# Patient Record
Sex: Male | Born: 1990 | Hispanic: No | Marital: Single | Smoking: Current some day smoker
Health system: Southern US, Community
[De-identification: ages and names within clinical notes are randomized; demographics above are authoritative.]

## PROBLEM LIST (undated history)

## (undated) DIAGNOSIS — I10 Essential (primary) hypertension: Secondary | ICD-10-CM

---

## 2018-01-30 ENCOUNTER — Emergency Department (HOSPITAL_COMMUNITY): Payer: Self-pay

## 2018-01-30 ENCOUNTER — Encounter (HOSPITAL_COMMUNITY): Payer: Self-pay

## 2018-01-30 ENCOUNTER — Emergency Department (HOSPITAL_COMMUNITY)
Admission: EM | Admit: 2018-01-30 | Discharge: 2018-01-30 | Disposition: A | Discharge status: Home / Self Care | Payer: Self-pay | Attending: Emergency Medicine | Admitting: Emergency Medicine

## 2018-01-30 ENCOUNTER — Other Ambulatory Visit: Payer: Self-pay

## 2018-01-30 DIAGNOSIS — Y9389 Activity, other specified: Secondary | ICD-10-CM | POA: Insufficient documentation

## 2018-01-30 DIAGNOSIS — R1032 Left lower quadrant pain: Secondary | ICD-10-CM | POA: Insufficient documentation

## 2018-01-30 DIAGNOSIS — S069X0A Unspecified intracranial injury without loss of consciousness, initial encounter: Secondary | ICD-10-CM

## 2018-01-30 DIAGNOSIS — S0081XA Abrasion of other part of head, initial encounter: Secondary | ICD-10-CM | POA: Insufficient documentation

## 2018-01-30 DIAGNOSIS — S062X9A Diffuse traumatic brain injury with loss of consciousness of unspecified duration, initial encounter: Secondary | ICD-10-CM | POA: Insufficient documentation

## 2018-01-30 DIAGNOSIS — Y92009 Unspecified place in unspecified non-institutional (private) residence as the place of occurrence of the external cause: Secondary | ICD-10-CM | POA: Insufficient documentation

## 2018-01-30 DIAGNOSIS — F17210 Nicotine dependence, cigarettes, uncomplicated: Secondary | ICD-10-CM | POA: Insufficient documentation

## 2018-01-30 DIAGNOSIS — Y999 Unspecified external cause status: Secondary | ICD-10-CM | POA: Insufficient documentation

## 2018-01-30 LAB — CBC WITH DIFFERENTIAL/PLATELET
Basophils Absolute: 0.1 10*3/uL (ref 0.0–0.1)
Basophils Relative: 1 %
Eosinophils Absolute: 0 10*3/uL (ref 0.0–0.7)
Eosinophils Relative: 0 %
HEMATOCRIT: 47.1 % (ref 39.0–52.0)
HEMOGLOBIN: 16.4 g/dL (ref 13.0–17.0)
LYMPHS PCT: 18 %
Lymphs Abs: 1.1 10*3/uL (ref 0.7–4.0)
MCH: 31.2 pg (ref 26.0–34.0)
MCHC: 34.8 g/dL (ref 30.0–36.0)
MCV: 89.7 fL (ref 78.0–100.0)
MONO ABS: 0.4 10*3/uL (ref 0.1–1.0)
MONOS PCT: 6 %
NEUTROS ABS: 4.7 10*3/uL (ref 1.7–7.7)
NEUTROS PCT: 75 %
Platelets: 260 10*3/uL (ref 150–400)
RBC: 5.25 MIL/uL (ref 4.22–5.81)
RDW: 12.7 % (ref 11.5–15.5)
WBC: 6.2 10*3/uL (ref 4.0–10.5)

## 2018-01-30 LAB — URINALYSIS, ROUTINE W REFLEX MICROSCOPIC
BACTERIA UA: NONE SEEN
Bilirubin Urine: NEGATIVE
GLUCOSE, UA: NEGATIVE mg/dL
Ketones, ur: NEGATIVE mg/dL
Leukocytes, UA: NEGATIVE
Nitrite: NEGATIVE
PH: 6 (ref 5.0–8.0)
PROTEIN: NEGATIVE mg/dL
Specific Gravity, Urine: 1.044 — ABNORMAL HIGH (ref 1.005–1.030)

## 2018-01-30 LAB — COMPREHENSIVE METABOLIC PANEL
ALBUMIN: 4.6 g/dL (ref 3.5–5.0)
ALT: 32 U/L (ref 0–44)
AST: 35 U/L (ref 15–41)
Alkaline Phosphatase: 80 U/L (ref 38–126)
Anion gap: 17 — ABNORMAL HIGH (ref 5–15)
BUN: 9 mg/dL (ref 6–20)
CHLORIDE: 99 mmol/L (ref 98–111)
CO2: 25 mmol/L (ref 22–32)
Calcium: 9.4 mg/dL (ref 8.9–10.3)
Creatinine, Ser: 0.72 mg/dL (ref 0.61–1.24)
GFR calc Af Amer: >60 mL/min (ref >60)
GFR calc non Af Amer: >60 mL/min (ref >60)
GLUCOSE: 92 mg/dL (ref 70–99)
POTASSIUM: 3.8 mmol/L (ref 3.5–5.1)
SODIUM: 141 mmol/L (ref 135–145)
Total Bilirubin: 0.7 mg/dL (ref 0.3–1.2)
Total Protein: 8.7 g/dL — ABNORMAL HIGH (ref 6.5–8.1)

## 2018-01-30 LAB — LIPASE, BLOOD: Lipase: 28 U/L (ref 11–51)

## 2018-01-30 LAB — CBG MONITORING, ED: GLUCOSE-CAPILLARY: 100 mg/dL — AB (ref 70–99)

## 2018-01-30 MED ORDER — IBUPROFEN 800 MG PO TABS: 800.0000 mg | ORAL_TABLET | Freq: Three times a day (TID) | ORAL | 0 refills | Status: DC | PRN

## 2018-01-30 MED ORDER — IOPAMIDOL (ISOVUE-300) INJECTION 61%
INTRAVENOUS | Status: AC
  Filled 2018-01-30: qty 100

## 2018-01-30 MED ORDER — ONDANSETRON 4 MG PO TBDP: 4.0000 mg | ORAL_TABLET | Freq: Three times a day (TID) | ORAL | 0 refills | Status: DC | PRN

## 2018-01-30 MED ORDER — IOPAMIDOL (ISOVUE-300) INJECTION 61%
100.0000 mL | Freq: Once | INTRAVENOUS | Status: AC | PRN
  Administered 2018-01-30: 100 mL via INTRAVENOUS

## 2018-01-30 MED ORDER — FENTANYL CITRATE (PF) 100 MCG/2ML IJ SOLN
50.0000 ug | Freq: Once | INTRAMUSCULAR | Status: AC
  Administered 2018-01-30: 50 ug via INTRAVENOUS
  Filled 2018-01-30: qty 2

## 2018-01-30 MED ORDER — SODIUM CHLORIDE 0.9 % IV BOLUS
1000.0000 mL | Freq: Once | INTRAVENOUS | Status: AC
  Administered 2018-01-30: 1000 mL via INTRAVENOUS

## 2018-01-30 NOTE — ED Notes (Signed)
In Xray and ct

## 2018-01-30 NOTE — ED Notes (Signed)
ED Provider at bedside. 
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
## 2018-01-30 NOTE — Discharge Instructions (Signed)
Usted fue visto hoy en el Departamento de Emergencia (DE) por una lesin en la cabeza. Segn su evaluacin, es posible que haya sufrido una conmocin cerebral (o hematoma) en el cerebro. Si le hicieron una tomografa computarizada, no mostr ninguna evidencia de lesin grave o sangrado.  Los sntomas que se pueden esperar de una conmocin cerebral incluyen nuseas, dolor de cabeza leve a moderado, dificultad para concentrarse o dormir, y Energy East Corporationmareos leves. Estos sntomas deberan Scientist, clinical (histocompatibility and immunogenetics)mejorar en los prximos das o [address], pero pueden pasar muchas semanas antes de que vuelva a la normalidad. Regrese al departamento de emergencias o haga un seguimiento con su mdico de atencin primaria si sus sntomas no mejoran [employer] este tiempo.  Los signos de una lesin en la cabeza ms grave incluyen vmitos, dolor de cabeza intenso, somnolencia o confusin excesiva, y debilidad o entumecimiento en la cara, brazos o piernas. Regrese de inmediato al Departamento de emergencias si experimenta alguno de estos sntomas ms preocupantes.  Descanse, evite la actividad fsica o mental extenuante y evite las actividades que podran provocar otra lesin en la cabeza hasta que todos los sntomas de esta lesin en la cabeza se resuelvan por completo durante al menos 2-3 semanas. Si participa en deportes, obtenga la autorizacin de su mdico o entrenador antes de volver a Leisure centre managerjugar. Puede tomar ibuprofeno o acetaminofeno sin receta mdica de acuerdo con las instrucciones de la etiqueta para el dolor de cabeza leve o dolor de cuero cabelludo.

## 2018-04-29 ENCOUNTER — Encounter (HOSPITAL_COMMUNITY): Payer: Self-pay

## 2018-04-29 ENCOUNTER — Other Ambulatory Visit: Payer: Self-pay

## 2018-04-29 ENCOUNTER — Emergency Department (HOSPITAL_COMMUNITY)
Admission: EM | Admit: 2018-04-29 | Discharge: 2018-04-29 | Disposition: A | Discharge status: Home / Self Care | Payer: Self-pay | Attending: Emergency Medicine | Admitting: Emergency Medicine

## 2018-04-29 DIAGNOSIS — R040 Epistaxis: Secondary | ICD-10-CM | POA: Insufficient documentation

## 2018-04-29 DIAGNOSIS — R042 Hemoptysis: Secondary | ICD-10-CM | POA: Insufficient documentation

## 2018-04-29 DIAGNOSIS — F17210 Nicotine dependence, cigarettes, uncomplicated: Secondary | ICD-10-CM | POA: Insufficient documentation

## 2018-04-29 NOTE — ED Provider Notes (Signed)
[facility]-EMERGENCY DEPT Provider Note   CSN: [id number] Arrival date & time: 04/29/18  1623     History   Chief Complaint Chief Complaint  Patient presents with  . Sore Throat    HPI Roy Hester is a 27 y.o. male.  HPI   Roy Hester is a 27 y.o. male, patient with no pertinent past medical history, presenting to the ED with blood in his sputum noted early this morning.  States he woke up to use the bathroom, coughed, and noted a speck of blood in the sputum.  Shortly prior to arrival, he notes that he sneezed and there was blood in the mucus, followed by a small trickle of blood.  Denies pain, fever, sore throat, continued cough, shortness of breath, chest pain, leg swelling, or any other complaints.  History reviewed. No pertinent past medical history.  There are no active problems to display for this patient.   History reviewed. No pertinent surgical history.      Home Medications    Prior to Admission medications   Medication Sig Start Date End Date Taking? Authorizing Provider  ibuprofen (ADVIL,MOTRIN) 800 MG tablet Take 1 tablet (800 mg total) by mouth every 8 (eight) hours as needed. 01/30/18   Long, Arlyss Repress, MD  ondansetron (ZOFRAN ODT) 4 MG disintegrating tablet Take 1 tablet (4 mg total) by mouth every 8 (eight) hours as needed for nausea or vomiting. 01/30/18   Long, Arlyss Repress, MD    Family History Family History  Problem Relation Age of Onset  . Healthy Mother   . Healthy Father     Social History Social History   Tobacco Use  . Smoking status: Current Some Day Smoker    Packs/day: 1.00    Types: Cigarettes  . Smokeless tobacco: Never Used  Substance Use Topics  . Alcohol use: Yes    Comment: Every now and then  . Drug use: Never     Allergies   Pork allergy   Review of Systems Review of Systems  Constitutional: Negative for fever.  HENT: Positive for nosebleeds.        Blood in sputum    Respiratory: Negative for shortness of breath.   Cardiovascular: Negative for chest pain.  Gastrointestinal: Negative for nausea and vomiting.     Physical Exam Updated Vital Signs BP (!) 145/91 (BP Location: Left Arm)   Pulse 86   Temp 98.5 F (36.9 C) (Oral)   Resp 16   Ht 5\' 5"  (1.651 m)   Wt 73.5 kg   SpO2 98%   BMI 26.96 kg/m   Physical Exam  Constitutional: He appears well-developed and well-nourished. No distress.  HENT:  Head: Normocephalic and atraumatic.  Mouth/Throat: Uvula is midline and oropharynx is clear and moist.  Small amount of dried blood in the left nare.  No active hemorrhage.  Eyes: Conjunctivae are normal.  Neck: Normal range of motion. Neck supple.  Cardiovascular: Normal rate and regular rhythm.  Pulmonary/Chest: Effort normal.  Lymphadenopathy:    He has no cervical adenopathy.  Neurological: He is alert.  Skin: Skin is warm and dry. He is not diaphoretic. No pallor.  Psychiatric: He has a normal mood and affect. His behavior is normal.  Nursing note and vitals reviewed.    ED Treatments / Results  Labs (all labs ordered are listed, but only abnormal results are displayed) Labs Reviewed - No data to display  EKG None  Radiology No results found.  Procedures Procedures (  including critical care time)  Medications Ordered in ED Medications - No data to display   Initial Impression / Assessment and Plan / ED Course  I have reviewed the triage vital signs and the nursing notes.  Pertinent labs & imaging results that were available during my care of the patient were reviewed by me and considered in my medical decision making (see chart for details).     Patient presents with a complaint of specks of blood in the sputum.  He later had a small nosebleed.  Suspect the source for the patient's bleeding may be his left nare.  Bleeding controlled at presentation. The patient was given instructions for home care as well as return  precautions. Patient voices understanding of these instructions, accepts the plan, and is comfortable with discharge.  Final Clinical Impressions(s) / ED Diagnoses   Final diagnoses:  Blood in sputum    ED Discharge Orders    None       Concepcion Living 04/29/18 1719    Long, Arlyss Repress, MD 04/30/18 [phone]

## 2018-04-29 NOTE — ED Triage Notes (Signed)
Patient reports that he had a sore throat since last night. Patient states when he clears his throat from mucus he has a "tiny amount of blood "in the sputum.

## 2018-04-29 NOTE — Discharge Instructions (Signed)
Apply Vaseline (petroleum jelly) into the nares of the nose to keep them moisturized and prevent bleeding. Please drink plenty of water to stay well-hydrated.
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
## 2020-12-29 ENCOUNTER — Other Ambulatory Visit: Payer: Self-pay

## 2020-12-29 ENCOUNTER — Encounter (HOSPITAL_COMMUNITY): Payer: Self-pay

## 2020-12-29 ENCOUNTER — Emergency Department (HOSPITAL_COMMUNITY)
Admission: EM | Admit: 2020-12-29 | Discharge: 2020-12-30 | Disposition: A | Discharge status: Home / Self Care | Payer: Self-pay | Attending: Emergency Medicine | Admitting: Emergency Medicine

## 2020-12-29 DIAGNOSIS — M546 Pain in thoracic spine: Secondary | ICD-10-CM | POA: Insufficient documentation

## 2020-12-29 DIAGNOSIS — F10129 Alcohol abuse with intoxication, unspecified: Secondary | ICD-10-CM | POA: Insufficient documentation

## 2020-12-29 DIAGNOSIS — F17210 Nicotine dependence, cigarettes, uncomplicated: Secondary | ICD-10-CM | POA: Insufficient documentation

## 2020-12-29 NOTE — ED Provider Notes (Signed)
[facility]-EMERGENCY DEPT Provider Note   CSN: [id number] Arrival date & time: 12/29/20  2248     History Chief Complaint  Patient presents with   Back Pain    Roy Hester is a 30 y.o. male.  The history is provided by the patient and medical records.  Back Pain  30 y.o. M presenting to the ED for reported back pain, however EMS reports they were called because he was concerned for low BP.  Patient had heavy EtOH today.  He is snoring on arrival to the ED and is not able to provide any reliable history.  LEVEL V CAVEAT APPLIES DUE TO INTOXICATION.  History reviewed. No pertinent past medical history.  There are no problems to display for this patient.   History reviewed. No pertinent surgical history.     Family History  Problem Relation Age of Onset   Healthy Mother    Healthy Father     Social History   Tobacco Use   Smoking status: Some Days    Packs/day: 1.00    Pack years: 0.00    Types: Cigarettes   Smokeless tobacco: Never  Vaping Use   Vaping Use: Never used  Substance Use Topics   Alcohol use: Yes    Comment: Every now and then   Drug use: Never    Home Medications Prior to Admission medications   Medication Sig Start Date End Date Taking? Authorizing Provider  ibuprofen (ADVIL,MOTRIN) 800 MG tablet Take 1 tablet (800 mg total) by mouth every 8 (eight) hours as needed. 01/30/18   Long, Arlyss Repress, MD  ondansetron (ZOFRAN ODT) 4 MG disintegrating tablet Take 1 tablet (4 mg total) by mouth every 8 (eight) hours as needed for nausea or vomiting. 01/30/18   Long, Arlyss Repress, MD    Allergies    Pork allergy  Review of Systems   Review of Systems  Unable to perform ROS: Other   Physical Exam Updated Vital Signs BP 138/88   Pulse (!) 108   Temp (!) 97.5 F (36.4 C) (Oral)   Resp 18   Ht 5\' 5"  (1.651 m)   Wt 73.5 kg   SpO2 94%   BMI 26.96 kg/m   Physical Exam Vitals and nursing note reviewed.  Constitutional:       Appearance: He is well-developed.     Comments: Snoring, smells of EtOH  HENT:     Head: Normocephalic and atraumatic.  Eyes:     Conjunctiva/sclera: Conjunctivae normal.     Pupils: Pupils are equal, round, and reactive to light.  Cardiovascular:     Rate and Rhythm: Normal rate and regular rhythm.     Heart sounds: Normal heart sounds.  Pulmonary:     Effort: Pulmonary effort is normal. No respiratory distress.     Breath sounds: Normal breath sounds. No rhonchi.  Abdominal:     General: Bowel sounds are normal.     Palpations: Abdomen is soft.     Tenderness: There is no abdominal tenderness. There is no rebound.  Musculoskeletal:        General: Normal range of motion.     Cervical back: Normal range of motion.     Comments: Left lumbar paraspinal muscular tenderness, no deformity or step-off, moving extremities well  Skin:    General: Skin is warm and dry.  Neurological:     Mental Status: He is alert and oriented to person, place, and time.    ED Results /  Procedures / Treatments   Labs (all labs ordered are listed, but only abnormal results are displayed) Labs Reviewed - No data to display  EKG None  Radiology No results found.  Procedures Procedures   Medications Ordered in ED Medications - No data to display  ED Course  I have reviewed the triage vital signs and the nursing notes.  Pertinent labs & imaging results that were available during my care of the patient were reviewed by me and considered in my medical decision making (see chart for details).    MDM Rules/Calculators/A&P  30 year old male presenting to the ED with reported back pain, however EMS reported he called out because he thought his blood pressure was low.  Does have EtOH on board and is snoring loudly on arrival to ED.  He does smell of EtOH.  He is not able to provide any reliable history at this time but is hemodynamically stable.  Will observe until able to give further  information.  5:29 AM After sleeping for a few hours patient now awake/alert.  He does report some left sided back pain after work.  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  Denies numbness/weakness of the legs.  No bowel or bladder incontinence.  No fevers, weight loss, history of cancer.  No hx of IVDU. Has some tenderness along left paraspinal musculature.  No midline deformity or step-off.  No focal deficits.  Suspect muscular strain.  Will treat symptomatically.  Advised not to mix medications with alcohol.  Encouraged to follow-up with PCP.  Return here for new concerns.  Final Clinical Impression(s) / ED Diagnoses Final diagnoses:  Acute left-sided thoracic back pain    Rx / DC Orders ED Discharge Orders          Ordered    ibuprofen (ADVIL) 800 MG tablet  3 times daily        12/30/20 0535    methocarbamol (ROBAXIN) 500 MG tablet  2 times daily        12/30/20 0535    lidocaine (LIDODERM) 5 %  Every 24 hours        12/30/20 0535             Garlon Hatchet, PA-C 12/30/20 0539    Palumbo, April, MD 12/30/20 [phone]

## 2020-12-29 NOTE — ED Triage Notes (Signed)
Patient complaining of back pain. Patient brought in by ptar. Patient called EMS because he thought his blood pressure was low. Patient wife Josephina [phone].

## 2020-12-30 MED ORDER — METHOCARBAMOL 500 MG PO TABS: 500.0000 mg | ORAL_TABLET | Freq: Two times a day (BID) | ORAL | 0 refills | Status: DC

## 2020-12-30 MED ORDER — IBUPROFEN 800 MG PO TABS: 800.0000 mg | ORAL_TABLET | Freq: Three times a day (TID) | ORAL | 0 refills | Status: DC

## 2020-12-30 MED ORDER — LIDOCAINE 5 % EX PTCH: 1.0000 | MEDICATED_PATCH | CUTANEOUS | 0 refills | Status: DC

## 2020-12-30 NOTE — Discharge Instructions (Addendum)
Take the prescribed medication as directed.  DO NOT MIX MEDICATIONS WITH ALCOHOL. Can use heating pad on back as well. Follow-up with your primary care doctor. Return to the ED for new or worsening symptoms.

## 2022-06-16 ENCOUNTER — Emergency Department (HOSPITAL_COMMUNITY)
Admission: EM | Admit: 2022-06-16 | Discharge: 2022-06-16 | Discharge status: Against Medical Advice | Payer: Self-pay | Attending: Emergency Medicine | Admitting: Emergency Medicine

## 2022-06-16 ENCOUNTER — Other Ambulatory Visit: Payer: Self-pay

## 2022-06-16 ENCOUNTER — Encounter (HOSPITAL_COMMUNITY): Payer: Self-pay

## 2022-06-16 ENCOUNTER — Emergency Department (HOSPITAL_COMMUNITY): Payer: Self-pay

## 2022-06-16 DIAGNOSIS — R0789 Other chest pain: Secondary | ICD-10-CM | POA: Insufficient documentation

## 2022-06-16 DIAGNOSIS — R7309 Other abnormal glucose: Secondary | ICD-10-CM | POA: Insufficient documentation

## 2022-06-16 DIAGNOSIS — Z5321 Procedure and treatment not carried out due to patient leaving prior to being seen by health care provider: Secondary | ICD-10-CM | POA: Insufficient documentation

## 2022-06-16 HISTORY — DX: Essential (primary) hypertension: I10

## 2022-06-16 LAB — SALICYLATE LEVEL: Salicylate Lvl: <7 mg/dL — ABNORMAL LOW (ref 7.0–30.0)

## 2022-06-16 LAB — COMPREHENSIVE METABOLIC PANEL
ALT: 30 U/L (ref 0–44)
AST: 49 U/L — ABNORMAL HIGH (ref 15–41)
Albumin: 4.2 g/dL (ref 3.5–5.0)
Alkaline Phosphatase: 88 U/L (ref 38–126)
Anion gap: 14 (ref 5–15)
BUN: 8 mg/dL (ref 6–20)
CO2: 24 mmol/L (ref 22–32)
Calcium: 8.7 mg/dL — ABNORMAL LOW (ref 8.9–10.3)
Chloride: 100 mmol/L (ref 98–111)
Creatinine, Ser: 0.79 mg/dL (ref 0.61–1.24)
GFR, Estimated: >60 mL/min (ref >60)
Glucose, Bld: 85 mg/dL (ref 70–99)
Potassium: 3.7 mmol/L (ref 3.5–5.1)
Sodium: 138 mmol/L (ref 135–145)
Total Bilirubin: 0.5 mg/dL (ref 0.3–1.2)
Total Protein: 8.8 g/dL — ABNORMAL HIGH (ref 6.5–8.1)

## 2022-06-16 LAB — CBC WITH DIFFERENTIAL/PLATELET
Abs Immature Granulocytes: 0.01 10*3/uL (ref 0.00–0.07)
Basophils Absolute: 0.1 10*3/uL (ref 0.0–0.1)
Basophils Relative: 1 %
Eosinophils Absolute: 0.1 10*3/uL (ref 0.0–0.5)
Eosinophils Relative: 2 %
HCT: 49 % (ref 39.0–52.0)
Hemoglobin: 16.9 g/dL (ref 13.0–17.0)
Immature Granulocytes: 0 %
Lymphocytes Relative: 32 %
Lymphs Abs: 1.9 10*3/uL (ref 0.7–4.0)
MCH: 30.3 pg (ref 26.0–34.0)
MCHC: 34.5 g/dL (ref 30.0–36.0)
MCV: 87.8 fL (ref 80.0–100.0)
Monocytes Absolute: 0.3 10*3/uL (ref 0.1–1.0)
Monocytes Relative: 5 %
Neutro Abs: 3.6 10*3/uL (ref 1.7–7.7)
Neutrophils Relative %: 60 %
Platelets: 306 10*3/uL (ref 150–400)
RBC: 5.58 MIL/uL (ref 4.22–5.81)
RDW: 12.7 % (ref 11.5–15.5)
WBC: 6 10*3/uL (ref 4.0–10.5)
nRBC: 0 % (ref 0.0–0.2)

## 2022-06-16 LAB — TROPONIN I (HIGH SENSITIVITY)
Troponin I (High Sensitivity): 16 ng/L (ref <18)
Troponin I (High Sensitivity): 13 ng/L (ref <18)

## 2022-06-16 LAB — LIPASE, BLOOD: Lipase: 45 U/L (ref 11–51)

## 2022-06-16 LAB — CBG MONITORING, ED: Glucose-Capillary: 249 mg/dL — ABNORMAL HIGH (ref 70–99)

## 2022-06-16 LAB — ETHANOL: Alcohol, Ethyl (B): 380 mg/dL (ref <10)

## 2022-06-16 LAB — ACETAMINOPHEN LEVEL: Acetaminophen (Tylenol), Serum: <10 ug/mL — ABNORMAL LOW (ref 10–30)

## 2022-06-16 NOTE — ED Provider Triage Note (Signed)
Emergency Medicine Provider Triage Evaluation Note  Roy Hester , a 31 y.o. male  was evaluated in triage.  Pt complains of chest pain.  He has pain on the left upper chest.  There is a difficulty obtaining history due to language barrier.  Patient appears to be confused and I suspect he is intoxicated.  He admits to drinking heavily for the past 6 months.  He also states that his wife just left him.  He complains of sharp pain on the left side of his chest is worse when he touches the chest.  He had Roy Hester, Roy Hester is a(n) 31 y.o. male who presents 2, 12 ounce beers prior to arrival.  During screening questions patient states that he does not want to live anymore..  Review of Systems  Positive: cp Negative: fever  Physical Exam  BP (!) 146/92 (BP Location: Right Arm)   Pulse (!) 121   Temp 98.7 F (37.1 C)   Resp 18   Ht 5\' 5"  (1.651 m)   Wt 73.5 kg   SpO2 97%   BMI 26.96 kg/m  Gen:   Awake, no distress   Resp:  Normal effort  MSK:   Moves extremities without difficulty  Other:  Appears uncomfortable   Medical Decision Making  Medically screening exam initiated at 3:19 PM.  Appropriate orders placed.  Roy Hester was informed that the remainder of the evaluation will be completed by another provider, this initial triage assessment does not replace that evaluation, and the importance of remaining in the ED until their evaluation is complete.     Benny Lennert, PA-C 06/16/22 [phone]

## 2022-06-16 NOTE — ED Notes (Signed)
Pt stated he still feels tired. SORT RN notified.

## 2022-06-16 NOTE — ED Notes (Signed)
Pt not visualized in lobby, not in restroom either.

## 2022-06-16 NOTE — ED Notes (Signed)
Pt states that he does not consume EtOH daily. States he feels weaker than her had. V/S repeated and similar to triage VS

## 2022-06-16 NOTE — ED Triage Notes (Signed)
PER EMS: pt is from home with c/o left sided chest pain that radiates to his left shoulder, worse with palpation, onset yesterday. He also admits to drinking two 12oz beers today. Triage done using spanish ipad interpreter.   BP- 148/98, HR-120, O2-97%

## 2022-06-20 ENCOUNTER — Emergency Department (HOSPITAL_COMMUNITY): Payer: Self-pay

## 2022-06-20 ENCOUNTER — Emergency Department (HOSPITAL_COMMUNITY)
Admission: EM | Admit: 2022-06-20 | Discharge: 2022-06-21 | Disposition: A | Discharge status: Home / Self Care | Payer: Self-pay | Attending: Emergency Medicine | Admitting: Emergency Medicine

## 2022-06-20 ENCOUNTER — Encounter (HOSPITAL_COMMUNITY): Payer: Self-pay

## 2022-06-20 ENCOUNTER — Other Ambulatory Visit: Payer: Self-pay

## 2022-06-20 DIAGNOSIS — R1031 Right lower quadrant pain: Secondary | ICD-10-CM | POA: Insufficient documentation

## 2022-06-20 DIAGNOSIS — R0789 Other chest pain: Secondary | ICD-10-CM | POA: Insufficient documentation

## 2022-06-20 DIAGNOSIS — R251 Tremor, unspecified: Secondary | ICD-10-CM | POA: Insufficient documentation

## 2022-06-20 DIAGNOSIS — F10930 Alcohol use, unspecified with withdrawal, uncomplicated: Secondary | ICD-10-CM

## 2022-06-20 DIAGNOSIS — R1011 Right upper quadrant pain: Secondary | ICD-10-CM | POA: Insufficient documentation

## 2022-06-20 DIAGNOSIS — Z1152 Encounter for screening for COVID-19: Secondary | ICD-10-CM | POA: Insufficient documentation

## 2022-06-20 DIAGNOSIS — R000 Tachycardia, unspecified: Secondary | ICD-10-CM | POA: Insufficient documentation

## 2022-06-20 LAB — CBC WITH DIFFERENTIAL/PLATELET
Abs Immature Granulocytes: 0.01 10*3/uL (ref 0.00–0.07)
Basophils Absolute: 0 10*3/uL (ref 0.0–0.1)
Basophils Relative: 0 %
Eosinophils Absolute: 0 10*3/uL (ref 0.0–0.5)
Eosinophils Relative: 0 %
HCT: 47.2 % (ref 39.0–52.0)
Hemoglobin: 16.4 g/dL (ref 13.0–17.0)
Immature Granulocytes: 0 %
Lymphocytes Relative: 20 %
Lymphs Abs: 1 10*3/uL (ref 0.7–4.0)
MCH: 30.5 pg (ref 26.0–34.0)
MCHC: 34.7 g/dL (ref 30.0–36.0)
MCV: 87.9 fL (ref 80.0–100.0)
Monocytes Absolute: 0.2 10*3/uL (ref 0.1–1.0)
Monocytes Relative: 4 %
Neutro Abs: 4 10*3/uL (ref 1.7–7.7)
Neutrophils Relative %: 76 %
Platelets: 203 10*3/uL (ref 150–400)
RBC: 5.37 MIL/uL (ref 4.22–5.81)
RDW: 12.5 % (ref 11.5–15.5)
WBC: 5.3 10*3/uL (ref 4.0–10.5)
nRBC: 0 % (ref 0.0–0.2)

## 2022-06-20 LAB — URINALYSIS, ROUTINE W REFLEX MICROSCOPIC
Bacteria, UA: NONE SEEN
Bilirubin Urine: NEGATIVE
Glucose, UA: NEGATIVE mg/dL
Hgb urine dipstick: NEGATIVE
Ketones, ur: NEGATIVE mg/dL
Leukocytes,Ua: NEGATIVE
Nitrite: NEGATIVE
Protein, ur: 100 mg/dL — AB
Specific Gravity, Urine: 1.021 (ref 1.005–1.030)
pH: 7 (ref 5.0–8.0)

## 2022-06-20 LAB — ETHANOL: Alcohol, Ethyl (B): <10 mg/dL (ref <10)

## 2022-06-20 LAB — COMPREHENSIVE METABOLIC PANEL
ALT: 44 U/L (ref 0–44)
AST: 77 U/L — ABNORMAL HIGH (ref 15–41)
Albumin: 4 g/dL (ref 3.5–5.0)
Alkaline Phosphatase: 96 U/L (ref 38–126)
Anion gap: 15 (ref 5–15)
BUN: 9 mg/dL (ref 6–20)
CO2: 27 mmol/L (ref 22–32)
Calcium: 8.6 mg/dL — ABNORMAL LOW (ref 8.9–10.3)
Chloride: 96 mmol/L — ABNORMAL LOW (ref 98–111)
Creatinine, Ser: 0.8 mg/dL (ref 0.61–1.24)
GFR, Estimated: >60 mL/min (ref >60)
Glucose, Bld: 102 mg/dL — ABNORMAL HIGH (ref 70–99)
Potassium: 3.1 mmol/L — ABNORMAL LOW (ref 3.5–5.1)
Sodium: 138 mmol/L (ref 135–145)
Total Bilirubin: 0.8 mg/dL (ref 0.3–1.2)
Total Protein: 8.7 g/dL — ABNORMAL HIGH (ref 6.5–8.1)

## 2022-06-20 LAB — TROPONIN I (HIGH SENSITIVITY): Troponin I (High Sensitivity): 13 ng/L (ref <18)

## 2022-06-20 LAB — D-DIMER, QUANTITATIVE: D-Dimer, Quant: 0.54 ug/mL-FEU — ABNORMAL HIGH (ref 0.00–0.50)

## 2022-06-20 LAB — LIPASE, BLOOD: Lipase: 42 U/L (ref 11–51)

## 2022-06-20 LAB — CK: Total CK: 235 U/L (ref 49–397)

## 2022-06-20 MED ORDER — IOHEXOL 350 MG/ML SOLN
50.0000 mL | Freq: Once | INTRAVENOUS | Status: AC | PRN
  Administered 2022-06-20: 50 mL via INTRAVENOUS

## 2022-06-20 MED ORDER — KETOROLAC TROMETHAMINE 15 MG/ML IJ SOLN
15.0000 mg | Freq: Once | INTRAMUSCULAR | Status: AC
  Administered 2022-06-20: 15 mg via INTRAVENOUS
  Filled 2022-06-20: qty 1

## 2022-06-20 MED ORDER — ONDANSETRON 4 MG PO TBDP
8.0000 mg | ORAL_TABLET | Freq: Once | ORAL | Status: AC
  Administered 2022-06-20: 8 mg via ORAL

## 2022-06-20 MED ORDER — LORAZEPAM 2 MG/ML IJ SOLN
2.0000 mg | Freq: Once | INTRAMUSCULAR | Status: AC
  Administered 2022-06-20: 2 mg via INTRAVENOUS
  Filled 2022-06-20: qty 1

## 2022-06-20 MED ORDER — ONDANSETRON 4 MG PO TBDP
8.0000 mg | ORAL_TABLET | Freq: Once | ORAL | Status: DC
  Filled 2022-06-20: qty 2

## 2022-06-20 MED ORDER — POTASSIUM CHLORIDE CRYS ER 20 MEQ PO TBCR
40.0000 meq | EXTENDED_RELEASE_TABLET | Freq: Once | ORAL | Status: AC
  Administered 2022-06-20: 40 meq via ORAL
  Filled 2022-06-20: qty 2

## 2022-06-20 MED ORDER — LACTATED RINGERS IV BOLUS
1000.0000 mL | Freq: Once | INTRAVENOUS | Status: AC
  Administered 2022-06-20: 1000 mL via INTRAVENOUS

## 2022-06-20 MED ORDER — THIAMINE HCL 100 MG PO TABS
100.0000 mg | ORAL_TABLET | Freq: Once | ORAL | Status: AC
  Administered 2022-06-20: 100 mg via ORAL
  Filled 2022-06-20 (×2): qty 1

## 2022-06-20 NOTE — ED Notes (Signed)
Pt was in triage and that's why not answering. Undid discharge dispo

## 2022-06-20 NOTE — ED Notes (Signed)
X3 no response for room assignment.

## 2022-06-20 NOTE — ED Provider Notes (Signed)
[facility] EMERGENCY DEPARTMENT Provider Note   CSN: [id number] Arrival date & time: 06/20/22  1056     History  No chief complaint on file.   Roy Hester is a 31 y.o. male.  HPI 31 year old male presents with a chief complaint of chest pain. History is taken through the Spanish interpreter. He's been having pain for a long time in his left chest, on and off. However recently it's been worse head is hard to describe what the pain feels like though sometimes it feels like an electric shock.  He is also had on and off headache, tingling in his legs, and now is having abdominal pain.  When he was in the waiting room he states that he got up on his legs and he had a hard time walking due to bilateral calf pain.  Some shortness of breath and cough for a couple weeks.  No fevers.  Pain is currently rated as severe.  He was here couple days ago, given medicine but then states that has not helped.  He states that he smokes occasionally and tells me that he drinks occasionally.  However after doing an exam and noticing significant tremor, I asked again and he tells me that he drinks daily, 7-8 large beers per day.  Last drink last night.  He has been drinking daily for months.  Home Medications Prior to Admission medications   Medication Sig Start Date End Date Taking? Authorizing Provider  ibuprofen (ADVIL) 800 MG tablet Take 1 tablet (800 mg total) by mouth 3 (three) times daily. 12/30/20   Garlon Hatchet, PA-C  lidocaine (LIDODERM) 5 % Place 1 patch onto the skin daily. Remove & Discard patch within 12 hours or as directed by MD 12/30/20   Garlon Hatchet, PA-C  methocarbamol (ROBAXIN) 500 MG tablet Take 1 tablet (500 mg total) by mouth 2 (two) times daily. 12/30/20   Garlon Hatchet, PA-C  ondansetron (ZOFRAN ODT) 4 MG disintegrating tablet Take 1 tablet (4 mg total) by mouth every 8 (eight) hours as needed for nausea or vomiting. 01/30/18   Long, Arlyss Repress, MD       Allergies    Pork allergy    Review of Systems   Review of Systems  Constitutional:  Negative for fever.  Respiratory:  Positive for cough and shortness of breath.   Cardiovascular:  Positive for chest pain. Negative for leg swelling.  Gastrointestinal:  Positive for abdominal pain.  Musculoskeletal:  Positive for myalgias.  Neurological:  Positive for headaches. Negative for weakness.    Physical Exam Updated Vital Signs BP (!) 150/101   Pulse (!) 114   Temp 99.2 F (37.3 C)   Resp (!) 24   SpO2 98%  Physical Exam Vitals and nursing note reviewed.  Constitutional:      Appearance: He is well-developed.  HENT:     Head: Normocephalic and atraumatic.  Eyes:     Extraocular Movements: Extraocular movements intact.     Pupils: Pupils are equal, round, and reactive to light.  Cardiovascular:     Rate and Rhythm: Regular rhythm. Tachycardia present.     Heart sounds: Normal heart sounds.  Pulmonary:     Effort: Pulmonary effort is normal.     Breath sounds: Normal breath sounds.  Chest:     Chest wall: Tenderness present.    Abdominal:     Palpations: Abdomen is soft.     Tenderness: There is abdominal tenderness in  the right upper quadrant and right lower quadrant.  Musculoskeletal:     Comments: Bilateral calves are generally tender, but no swelling appreciated  Skin:    General: Skin is warm and dry.  Neurological:     Mental Status: He is alert.     Motor: Tremor (bilateral hands) present.     Comments: CN 3-12 grossly intact. 5/5 strength in all 4 extremities. Grossly normal sensation. Normal finger to nose.      ED Results / Procedures / Treatments   Labs (all labs ordered are listed, but only abnormal results are displayed) Labs Reviewed  COMPREHENSIVE METABOLIC PANEL - Abnormal; Notable for the following components:      Result Value   Potassium 3.1 (*)    Chloride 96 (*)    Glucose, Bld 102 (*)    Calcium 8.6 (*)    Total Protein 8.7 (*)    AST  77 (*)    All other components within normal limits  URINALYSIS, ROUTINE W REFLEX MICROSCOPIC - Abnormal; Notable for the following components:   Color, Urine AMBER (*)    APPearance HAZY (*)    Protein, ur 100 (*)    All other components within normal limits  D-DIMER, QUANTITATIVE - Abnormal; Notable for the following components:   D-Dimer, Quant 0.54 (*)    All other components within normal limits  RESP PANEL BY RT-PCR (RSV, FLU A&B, COVID)  [id number]  CBC WITH DIFFERENTIAL/PLATELET  LIPASE, BLOOD  ETHANOL  RAPID URINE DRUG SCREEN, HOSP PERFORMED  CK  TROPONIN I (HIGH SENSITIVITY)    EKG EKG Interpretation  Date/Time:  Saturday June 20 2022 20:43:32 EST Ventricular Rate:  112 PR Interval:  128 QRS Duration: 88 QT Interval:  324 QTC Calculation: 442 R Axis:   85 Text Interpretation: Sinus tachycardia  Diffuse ST/T changes similar to earlier in the day Confirmed by Pricilla Loveless [phone]) on 06/20/2022 9:29:46 PM  Radiology DG Chest Portable 1 View  Result Date: 06/20/2022 CLINICAL DATA:  Chest pain. EXAM: PORTABLE CHEST 1 VIEW COMPARISON:  None Available. FINDINGS: The heart size and mediastinal contours are within normal limits. Both lungs are clear. The visualized skeletal structures are unremarkable. IMPRESSION: No active disease. Electronically Signed   By: Larose Hires D.O.   On: 06/20/2022 22:31   CT Renal Stone Study  Result Date: 06/20/2022 CLINICAL DATA:  Leg pain.  Suspicion for stone. EXAM: CT ABDOMEN AND PELVIS WITHOUT CONTRAST TECHNIQUE: Multidetector CT imaging of the abdomen and pelvis was performed following the standard protocol without IV contrast. RADIATION DOSE REDUCTION: This exam was performed according to the departmental dose-optimization program which includes automated exposure control, adjustment of the mA and/or kV according to patient size and/or use of iterative reconstruction technique. COMPARISON:  01/30/2018. FINDINGS: Lower chest: Clear  lung bases. Hepatobiliary: Liver normal in size. Decreased liver attenuation consistent with fatty infiltration. No liver mass. Normal gallbladder. No bile duct dilation. Pancreas: Unremarkable. No pancreatic ductal dilatation or surrounding inflammatory changes. Spleen: Normal in size without focal abnormality. Adrenals/Urinary Tract: Normal adrenal glands. Kidneys normal in size, orientation and position. No renal mass, stone or hydronephrosis. Normal ureters. Bladder decompressed, otherwise unremarkable. Stomach/Bowel: Stomach is within normal limits. Appendix appears normal. No evidence of bowel wall thickening, distention, or inflammatory changes. Vascular/Lymphatic: No significant vascular findings are present. No enlarged abdominal or pelvic lymph nodes. Reproductive: Unremarkable. Other: No abdominal wall hernia or abnormality. No abdominopelvic ascites. Musculoskeletal: No acute or significant osseous findings. IMPRESSION: 1. No acute  findings within the abdomen or pelvis. No findings to account for the patient's pain. No renal or ureteral stones or obstructive uropathy. 2. Hepatic steatosis. Electronically Signed   By: Amie Portland M.D.   On: 06/20/2022 12:53    Procedures Procedures    Medications Ordered in ED Medications  lactated ringers bolus 1,000 mL (1,000 mLs Intravenous New Bag/Given 06/20/22 2228)  ketorolac (TORADOL) 15 MG/ML injection 15 mg (15 mg Intravenous Given 06/20/22 2224)  LORazepam (ATIVAN) injection 2 mg (2 mg Intravenous Given 06/20/22 2223)  thiamine (VITAMIN B1) tablet 100 mg (100 mg Oral Given 06/20/22 2225)  ondansetron (ZOFRAN-ODT) disintegrating tablet 8 mg (8 mg Oral Given 06/20/22 2229)    ED Course/ Medical Decision Making/ A&P                           Medical Decision Making Amount and/or Complexity of Data Reviewed Labs: ordered.    Details: Normal WBC, lipase, mild hypokalemia. Radiology: ordered and independent interpretation performed.     Details: No pneumothorax ECG/medicine tests: independent interpretation performed.    Details: Nonspecific ST changes, unchanged.  Risk OTC drugs. Prescription drug management.   Patient has multiple symptoms. One of which is chest pain, which is hard to pin down how often or when it has been going on.  He is pretty tachycardic which ultimately I suspect is from alcohol withdrawal.  However with vague leg symptoms including feeling like his left leg was swollen, chest pain, tachycardia, will workup for PE which includes D-dimer (positive) and CTA.  However I also think he is in alcohol withdrawal, especially given his exam and negative EtOH level.  Will give IV Ativan, fluids, and will give Toradol for pain.  Otherwise his neuroexam is unremarkable.  He is not altered. Care transferred to Dr. Blinda Leatherwood.        Final Clinical Impression(s) / ED Diagnoses Final diagnoses:  None    Rx / DC Orders ED Discharge Orders     None         Pricilla Loveless, MD 06/20/22 2312

## 2022-06-20 NOTE — ED Provider Triage Note (Signed)
Emergency Medicine Provider Triage Evaluation Note  Roy Hester , a 31 y.o. male  was evaluated in triage.  Pt complains of left-sided thoracic chest pain as well as right-sided normal pain.  Patient reports history of "2 years" of left-sided thoracic chest pain.  Was seen on the 19th for the same symptoms.  Notes persistence of symptoms since onset.  Pain is worsened with pressure.  Reports right-sided flank pain.  Has noted some hematuria as well as feelings of nausea as of this morning.  States that pain has been present for the past 2 to 3 months..  Review of Systems  Positive: See above Negative:   Physical Exam  BP (!) 133/98   Pulse (!) 104   Temp 98.9 F (37.2 C) (Oral)   Resp 16   SpO2 98%  Gen:   Awake, no distress   Resp:  Normal effort  MSK:   Moves extremities without difficulty  Other:  Right-sided CVA tenderness.  Right-sided abdominal tenderness.  Lungs clear to auscultation bilaterally.  No obvious murmurs, gallops, rubs.  No lower extremity edema appreciated.  Medical Decision Making  Medically screening exam initiated at 11:57 AM.  Appropriate orders placed.  Davaughn Hillyard was informed that the remainder of the evaluation will be completed by another provider, this initial triage assessment does not replace that evaluation, and the importance of remaining in the ED until their evaluation is complete.     Peter Garter, [place] 06/20/22 1158

## 2022-06-20 NOTE — ED Notes (Signed)
Patient came to Clinical research associate and stated that he was having severe chest pain and a hard time catching his breath. This Clinical research associate took his vitals and alerted the triage RN. A repeat  EKG was performed and given to MD, who then asked for another EKG. An additional EKG was performed and pt was brought to assigned room.

## 2022-06-20 NOTE — ED Triage Notes (Signed)
Patient complains of right sided abdominal pain x 3 months, just discharged this am from hospital. Previous alcohol abuse. Nausea with same.

## 2022-06-21 LAB — RESP PANEL BY RT-PCR (RSV, FLU A&B, COVID)  RVPGX2
Influenza A by PCR: NEGATIVE
Influenza B by PCR: NEGATIVE
Resp Syncytial Virus by PCR: NEGATIVE
SARS Coronavirus 2 by RT PCR: NEGATIVE

## 2022-06-21 MED ORDER — CHLORDIAZEPOXIDE HCL 25 MG PO CAPS: ORAL_CAPSULE | ORAL | 0 refills | Status: DC

## 2022-06-21 NOTE — ED Provider Notes (Signed)
Patient signed out to me by Dr. Criss Alvine.  Patient seen with multiple complaints earlier today.  Patient with chest pain that seems to be somewhat chronic in nature as well as pains all over his body.  At time of signout, patient with noted tachycardia and mildly elevated D-dimer.  Patient does have a history of chronic daily alcohol intake, has not drank today.  It is felt that the vital sign abnormalities are secondary to mild withdrawal symptoms, CT angiography was ordered to rule out PE.  This has now been performed and is negative for PE or other chest pathology.  Patient's workup has been otherwise reassuring.  Patient given fluids, Ativan and vital signs have improved.  He does not appear to have any residual significant withdrawal symptoms, will be reasonable for discharge with Librium taper, given return precautions for more serious withdrawal symptoms.   Gilda Crease, MD 06/21/22 [phone]

## 2022-07-04 ENCOUNTER — Emergency Department (HOSPITAL_COMMUNITY): Payer: Self-pay

## 2022-07-04 ENCOUNTER — Other Ambulatory Visit: Payer: Self-pay

## 2022-07-04 ENCOUNTER — Observation Stay (HOSPITAL_COMMUNITY): Payer: Self-pay

## 2022-07-04 ENCOUNTER — Inpatient Hospital Stay (HOSPITAL_COMMUNITY)
Admission: EM | Admit: 2022-07-04 | Discharge: 2022-07-07 | DRG: 897 | Disposition: A | Discharge status: Home / Self Care | Payer: Self-pay | Attending: Family Medicine | Admitting: Family Medicine

## 2022-07-04 ENCOUNTER — Encounter (HOSPITAL_COMMUNITY): Payer: Self-pay | Admitting: Pharmacy Technician

## 2022-07-04 DIAGNOSIS — R109 Unspecified abdominal pain: Secondary | ICD-10-CM | POA: Diagnosis present

## 2022-07-04 DIAGNOSIS — R1011 Right upper quadrant pain: Secondary | ICD-10-CM | POA: Diagnosis present

## 2022-07-04 DIAGNOSIS — R1031 Right lower quadrant pain: Secondary | ICD-10-CM | POA: Diagnosis present

## 2022-07-04 DIAGNOSIS — R519 Headache, unspecified: Secondary | ICD-10-CM | POA: Diagnosis present

## 2022-07-04 DIAGNOSIS — Z79899 Other long term (current) drug therapy: Secondary | ICD-10-CM

## 2022-07-04 DIAGNOSIS — M25579 Pain in unspecified ankle and joints of unspecified foot: Secondary | ICD-10-CM | POA: Insufficient documentation

## 2022-07-04 DIAGNOSIS — F10939 Alcohol use, unspecified with withdrawal, unspecified: Secondary | ICD-10-CM

## 2022-07-04 DIAGNOSIS — R000 Tachycardia, unspecified: Secondary | ICD-10-CM | POA: Diagnosis present

## 2022-07-04 DIAGNOSIS — R3121 Asymptomatic microscopic hematuria: Secondary | ICD-10-CM | POA: Diagnosis present

## 2022-07-04 DIAGNOSIS — F10930 Alcohol use, unspecified with withdrawal, uncomplicated: Secondary | ICD-10-CM

## 2022-07-04 DIAGNOSIS — R350 Frequency of micturition: Secondary | ICD-10-CM | POA: Diagnosis not present

## 2022-07-04 DIAGNOSIS — R7401 Elevation of levels of liver transaminase levels: Secondary | ICD-10-CM | POA: Diagnosis present

## 2022-07-04 DIAGNOSIS — Z9181 History of falling: Secondary | ICD-10-CM

## 2022-07-04 DIAGNOSIS — M25571 Pain in right ankle and joints of right foot: Secondary | ICD-10-CM | POA: Diagnosis present

## 2022-07-04 DIAGNOSIS — R369 Urethral discharge, unspecified: Secondary | ICD-10-CM | POA: Diagnosis not present

## 2022-07-04 DIAGNOSIS — Y903 Blood alcohol level of 60-79 mg/100 ml: Secondary | ICD-10-CM | POA: Diagnosis present

## 2022-07-04 DIAGNOSIS — F10129 Alcohol abuse with intoxication, unspecified: Secondary | ICD-10-CM | POA: Diagnosis present

## 2022-07-04 DIAGNOSIS — F17200 Nicotine dependence, unspecified, uncomplicated: Secondary | ICD-10-CM | POA: Diagnosis present

## 2022-07-04 DIAGNOSIS — N4821 Abscess of corpus cavernosum and penis: Secondary | ICD-10-CM | POA: Diagnosis present

## 2022-07-04 DIAGNOSIS — F10130 Alcohol abuse with withdrawal, uncomplicated: Principal | ICD-10-CM | POA: Diagnosis present

## 2022-07-04 DIAGNOSIS — I10 Essential (primary) hypertension: Secondary | ICD-10-CM | POA: Diagnosis present

## 2022-07-04 DIAGNOSIS — R197 Diarrhea, unspecified: Secondary | ICD-10-CM | POA: Diagnosis present

## 2022-07-04 DIAGNOSIS — E876 Hypokalemia: Secondary | ICD-10-CM | POA: Diagnosis not present

## 2022-07-04 DIAGNOSIS — R0789 Other chest pain: Secondary | ICD-10-CM | POA: Diagnosis present

## 2022-07-04 DIAGNOSIS — R300 Dysuria: Secondary | ICD-10-CM | POA: Diagnosis present

## 2022-07-04 LAB — TROPONIN I (HIGH SENSITIVITY)
Troponin I (High Sensitivity): 8 ng/L (ref <18)
Troponin I (High Sensitivity): 7 ng/L (ref <18)

## 2022-07-04 LAB — URINALYSIS, ROUTINE W REFLEX MICROSCOPIC
Bilirubin Urine: NEGATIVE
Glucose, UA: NEGATIVE mg/dL
Ketones, ur: 5 mg/dL — AB
Leukocytes,Ua: NEGATIVE
Nitrite: NEGATIVE
Protein, ur: 30 mg/dL — AB
Specific Gravity, Urine: 1.018 (ref 1.005–1.030)
pH: 5 (ref 5.0–8.0)

## 2022-07-04 LAB — CBC
HCT: 46.3 % (ref 39.0–52.0)
Hemoglobin: 15.3 g/dL (ref 13.0–17.0)
MCH: 30.2 pg (ref 26.0–34.0)
MCHC: 33 g/dL (ref 30.0–36.0)
MCV: 91.5 fL (ref 80.0–100.0)
Platelets: 380 10*3/uL (ref 150–400)
RBC: 5.06 MIL/uL (ref 4.22–5.81)
RDW: 13.3 % (ref 11.5–15.5)
WBC: 6.1 10*3/uL (ref 4.0–10.5)
nRBC: 0 % (ref 0.0–0.2)

## 2022-07-04 LAB — COMPREHENSIVE METABOLIC PANEL
ALT: 36 U/L (ref 0–44)
AST: 47 U/L — ABNORMAL HIGH (ref 15–41)
Albumin: 4 g/dL (ref 3.5–5.0)
Alkaline Phosphatase: 78 U/L (ref 38–126)
Anion gap: 18 — ABNORMAL HIGH (ref 5–15)
BUN: 7 mg/dL (ref 6–20)
CO2: 22 mmol/L (ref 22–32)
Calcium: 8.7 mg/dL — ABNORMAL LOW (ref 8.9–10.3)
Chloride: 99 mmol/L (ref 98–111)
Creatinine, Ser: 0.83 mg/dL (ref 0.61–1.24)
GFR, Estimated: >60 mL/min (ref >60)
Glucose, Bld: 89 mg/dL (ref 70–99)
Potassium: 3.9 mmol/L (ref 3.5–5.1)
Sodium: 139 mmol/L (ref 135–145)
Total Bilirubin: 0.6 mg/dL (ref 0.3–1.2)
Total Protein: 8.2 g/dL — ABNORMAL HIGH (ref 6.5–8.1)

## 2022-07-04 LAB — LIPASE, BLOOD: Lipase: 35 U/L (ref 11–51)

## 2022-07-04 LAB — ETHANOL: Alcohol, Ethyl (B): 73 mg/dL — ABNORMAL HIGH (ref <10)

## 2022-07-04 LAB — D-DIMER, QUANTITATIVE: D-Dimer, Quant: 0.27 ug/mL-FEU (ref 0.00–0.50)

## 2022-07-04 MED ORDER — LORAZEPAM 1 MG PO TABS
2.0000 mg | ORAL_TABLET | Freq: Four times a day (QID) | ORAL | Status: DC
  Administered 2022-07-04: 2 mg via ORAL
  Filled 2022-07-04: qty 2

## 2022-07-04 MED ORDER — THIAMINE MONONITRATE 100 MG PO TABS
100.0000 mg | ORAL_TABLET | Freq: Every day | ORAL | Status: DC
  Administered 2022-07-04 – 2022-07-07 (×4): 100 mg via ORAL
  Filled 2022-07-04 (×4): qty 1

## 2022-07-04 MED ORDER — SODIUM CHLORIDE 0.9 % IV BOLUS
1000.0000 mL | Freq: Once | INTRAVENOUS | Status: AC
  Administered 2022-07-04: 1000 mL via INTRAVENOUS

## 2022-07-04 MED ORDER — FOLIC ACID 1 MG PO TABS
1.0000 mg | ORAL_TABLET | Freq: Every day | ORAL | Status: DC
  Administered 2022-07-04 – 2022-07-07 (×4): 1 mg via ORAL
  Filled 2022-07-04 (×4): qty 1

## 2022-07-04 MED ORDER — LACTATED RINGERS IV SOLN: INTRAVENOUS | Status: AC

## 2022-07-04 MED ORDER — LACTATED RINGERS IV SOLN: INTRAVENOUS | Status: DC

## 2022-07-04 MED ORDER — ONDANSETRON HCL 4 MG/2ML IJ SOLN
4.0000 mg | Freq: Four times a day (QID) | INTRAMUSCULAR | Status: DC | PRN
  Administered 2022-07-05: 4 mg via INTRAVENOUS
  Filled 2022-07-04: qty 2

## 2022-07-04 MED ORDER — THIAMINE HCL 100 MG/ML IJ SOLN: 100.0000 mg | Freq: Every day | INTRAMUSCULAR | Status: DC

## 2022-07-04 MED ORDER — LORAZEPAM 2 MG/ML IJ SOLN
1.0000 mg | INTRAMUSCULAR | Status: DC | PRN
  Administered 2022-07-05: 2 mg via INTRAVENOUS
  Administered 2022-07-05: 1 mg via INTRAVENOUS
  Filled 2022-07-04 (×2): qty 1

## 2022-07-04 MED ORDER — LORAZEPAM 1 MG PO TABS: 2.0000 mg | ORAL_TABLET | Freq: Two times a day (BID) | ORAL | Status: DC

## 2022-07-04 MED ORDER — LORAZEPAM 1 MG PO TABS
1.0000 mg | ORAL_TABLET | ORAL | Status: DC | PRN
  Administered 2022-07-04: 2 mg via ORAL
  Administered 2022-07-04: 3 mg via ORAL
  Filled 2022-07-04: qty 2
  Filled 2022-07-04: qty 3

## 2022-07-04 MED ORDER — THIAMINE MONONITRATE 100 MG PO TABS
100.0000 mg | ORAL_TABLET | Freq: Every day | ORAL | Status: DC
  Administered 2022-07-04: 100 mg via ORAL
  Filled 2022-07-04: qty 1

## 2022-07-04 MED ORDER — ADULT MULTIVITAMIN W/MINERALS CH
1.0000 | ORAL_TABLET | Freq: Every day | ORAL | Status: DC
  Administered 2022-07-04 – 2022-07-07 (×4): 1 via ORAL
  Filled 2022-07-04 (×4): qty 1

## 2022-07-04 MED ORDER — ONDANSETRON HCL 4 MG/2ML IJ SOLN
4.0000 mg | Freq: Once | INTRAMUSCULAR | Status: AC
  Administered 2022-07-04: 4 mg via INTRAVENOUS
  Filled 2022-07-04: qty 2

## 2022-07-04 MED ORDER — KETOROLAC TROMETHAMINE 15 MG/ML IJ SOLN
15.0000 mg | Freq: Once | INTRAMUSCULAR | Status: AC
  Administered 2022-07-04: 15 mg via INTRAVENOUS
  Filled 2022-07-04: qty 1

## 2022-07-04 MED ORDER — ACETAMINOPHEN 500 MG PO TABS
1000.0000 mg | ORAL_TABLET | Freq: Four times a day (QID) | ORAL | Status: DC | PRN
  Administered 2022-07-05 – 2022-07-06 (×2): 1000 mg via ORAL
  Filled 2022-07-04 (×2): qty 2

## 2022-07-04 MED ORDER — IOHEXOL 350 MG/ML SOLN
75.0000 mL | Freq: Once | INTRAVENOUS | Status: AC | PRN
  Administered 2022-07-04: 75 mL via INTRAVENOUS

## 2022-07-04 MED ORDER — MORPHINE SULFATE (PF) 4 MG/ML IV SOLN
4.0000 mg | Freq: Once | INTRAVENOUS | Status: AC
  Administered 2022-07-04: 4 mg via INTRAVENOUS
  Filled 2022-07-04: qty 1

## 2022-07-04 MED ORDER — ENOXAPARIN SODIUM 40 MG/0.4ML IJ SOSY: 40.0000 mg | PREFILLED_SYRINGE | INTRAMUSCULAR | Status: DC

## 2022-07-04 MED ORDER — LORAZEPAM 1 MG PO TABS: 0.0000 mg | ORAL_TABLET | Freq: Two times a day (BID) | ORAL | Status: DC

## 2022-07-04 MED ORDER — FONDAPARINUX SODIUM 2.5 MG/0.5ML ~~LOC~~ SOLN
2.5000 mg | SUBCUTANEOUS | Status: DC
  Administered 2022-07-05 – 2022-07-06 (×2): 2.5 mg via SUBCUTANEOUS
  Filled 2022-07-04 (×4): qty 0.5

## 2022-07-04 MED ORDER — LORAZEPAM 1 MG PO TABS
0.0000 mg | ORAL_TABLET | Freq: Four times a day (QID) | ORAL | Status: DC
  Administered 2022-07-04: 3 mg via ORAL
  Filled 2022-07-04: qty 3

## 2022-07-04 NOTE — Hospital Course (Addendum)
Roy Hester is a 32 y.o. male that presented with chest pain and pre-syncope found to be in alcohol withdrawal. PMH significant for alcohol use disorder.  Alcohol withdrawal Patient admitted for chest pain and pre-syncope after reported binge drinking episode the night prior. Patient required Ativan during hospitalization and was also given thiamine and folate.   Abdominal pain Noted on presentation, felt to be related to alcohol withdrawal and effects of the recent binge drinking. CMP notable for mildly elevated AST but otherwise reassuring. Patient was given fluids with improvement.   Follow up items Alcohol cessation Address intermittent chest pain out patient

## 2022-07-04 NOTE — ED Triage Notes (Signed)
Pt here via pov with reports of chest pain. Pain worse with palpation. Pt also with RLQ abdominal pain.  12 lead ST depression in I, II, avf. Given 324mg  asa and 1 sublingual Nitro with no improvement. HR ST 120-130 160/114

## 2022-07-04 NOTE — H&P (Addendum)
Hospital Admission History and Physical Service Pager: [phone]  Patient name: Roy Hester Medical record number: [id number] Date of Birth: 01-May-1991 Age: 32 y.o. Gender: male  Primary Care Provider: Patient, No Pcp Per Consultants: None Code Status: Full which was confirmed with patient Preferred Emergency Contact: Efraim (last name unknown, phone number on patient's cell but no power)  Chief Complaint: Chest pain  Assessment and Plan: Roy Hester is a 32 y.o. male with a PMH of alcohol use disorder and HTN, presenting with chest pain and presyncope found to be in alcohol withdrawal.  Differential for this patient's presentation includes severe hangover, acute hepatitis, biliary colic, and acute pancreatitis. Alcohol withdrawal is the most likely diagnosis given that this patient's CT abdomen, LFTs, and lipase all unremarkable.  * Alcohol withdrawal (HCC) Last drink 1/5 around 7pm. CIWA overnight reached 17, last 2 were 4>2. S/p 5mg  of Ativan overnight. Tachycardia improved. - Initiate CIWA precautions with PRN ativan >10 CIWA score - Ondansetron 4 mg Q6H PRN - D/c cardiac monitoring - Thiamine 100 mg PO daily and folate 1 mg PO daily - mIVF  Abdominal pain Abdominal exam is concerning for hepatic or biliary pathologies though not supported by labs/studies. It is likely patient is experiencing symptoms of alcohol withdrawal with superimposed aftereffects of recent alcohol binge. Can also consider gastroenteritis as pt had diarrhea and vomited this morning. Will treat with supportive care and fluids for now. -CTM - Consider RUQ ultrasound if not clinically improving in 24 hours or if new symptoms develop -Tylenol PRN - AM CMP & CBC  Asymptomatic microscopic hematuria Hgb on UA without UTI. 0-5 RBCs on hpf. Pt smokes. Can consider repeating to r/o hematuria.   FEN/GI: Regular diet, 125 mL/hr LR IVF for 12 hours VTE Prophylaxis: Lovenox  Disposition:  Med-tele  History of Present Illness:    Roy Hester is a 32 y.o. male presenting with left-sided chest pain and LOC.  When he woke up in the morning had a headache and dizziness.  Subsequently lost consciousness for approximately 10 minutes while getting dressed.  LOC was unwitnessed.  Endorses hitting back of head on bed frame.  Upon awakening, called EMS.  Does not remember if experienced incontinence during LOC.  Has had some RUQ pain and heard that he has a "liver illness" from another physician previously.  Vomited this morning after drinking some water.  Yesterday, drank 12 pack of beer and a bottle of [employer].  States that he had not had any alcohol at all in 5 months prior to yesterday(?).  Denies drinking any alcohol today, states that he intermittently has several days in a row when he does not drink at all.  Last drink was yesterday at 7 pm.  Thinks he was hospitalized for alcohol withdrawal 8 months ago.  Unsure if he has had alcohol withdrawal seizures before.  Patient has difficulty recalling his emergency contact's (good friend) last name.  In the ED, patient initially felt better with toradol 15 mg IV, Zofran, fluids, and ativan (2 mg), and morphine 4mg  IV,  but heart rate remained in the 130-140 range and patient developed back pain as well as discomfort when breathing.  ACS workup and D-dimer negative.  CT abdomen performed for RLQ pain and was unremarkable.  HR remained elevated s/p 2 fluid boluses and 1x ativan.  Patient was also seen in ED on 12/23 with similar presentation including chest pain and treated for alcohol withdrawal, discharged with Librium taper.  Also came  to ED on 12/19 with very similar presentation.  Review Of Systems: Per HPI with the following additions: Endorses some blood in his urine recently, also has not had much water recently to drink. Has recently had diarrhea. Also notes he has been having "electrical shock" pain of chest that restricts  breathing for a while.  Pertinent Past Medical History: - Alcohol use disorder - Alcohol withdrawal history - HTN, not on meds Remainder reviewed in history tab.   Pertinent Past Surgical History: None Remainder reviewed in history tab.  Pertinent Social History: Tobacco use: Yes Alcohol use: 12 pack of beer and a [employer] bottle of liquor (possibly fifth) on average daily Other Substance use: No Lives alone.  Pertinent Family History: None Remainder reviewed in history tab.   Important Outpatient Medications: Recently completed 7 day Librium taper.  Remainder reviewed in medication history.   Objective: BP 121/81   Pulse 99   Temp 98.3 F (36.8 C) (Oral)   Resp 18   Wt 72.6 kg   SpO2 96%   BMI 26.63 kg/m  Exam: General: Alert, pleasant, speaking in full sentences. Laying in bed comfortably, but appears uncomfortable when moving to sitting up position.  Eyes: PERRLA. NCAT. Scleral icterus, no conjunctival erythema or injections. ENTM: Dry mucous membranes.  Normocephalic. Cardiovascular: RRR, no murmurs. Skin warm and well perfused. Respiratory: CTAB. No rales/crackles/wheezes. Gastrointestinal: TTP in RUQ with rebound and guarding. Derm: Appears mildly jaundiced  Labs:  CBC BMET  Recent Labs  Lab 07/05/22 0317  WBC 4.9  HGB 13.2  HCT 38.4*  PLT 283   Recent Labs  Lab 07/05/22 0317  NA 133*  K 3.6  CL 101  CO2 25  BUN 11  CREATININE 0.83  GLUCOSE 86  CALCIUM 8.7*    Pertinent additional labs:  Component Ref Range & Units 14:07 2 wk ago  D-Dimer, Quant 0.00 - 0.50 ug/mL-FEU 0.27 0.54 High    Component Ref Range & Units 11:52 2 wk ago  Alcohol, Ethyl (B) <10 mg/dL 73 High  380 High   Component Ref Range & Units 09:41 (07/04/22) 2 wk ago (06/20/22)  Sodium 135 - 145 mmol/L 139 138  Potassium 3.5 - 5.1 mmol/L 3.9 3.1 Low   Chloride 98 - 111 mmol/L 99 96 Low   CO2 22 - 32 mmol/L 22 27  Creatinine, Ser 0.61 - 1.24 mg/dL 0.83 0.80  Calcium  8.9 - 10.3 mg/dL 8.7 Low  8.6 Low   Total Protein 6.5 - 8.1 g/dL 8.2 High  8.7 High   Albumin 3.5 - 5.0 g/dL 4.0 4.0  AST 15 - 41 U/L 47 High  77 High   ALT 0 - 44 U/L 36 44  Alkaline Phosphatase 38 - 126 U/L 78 96  Total Bilirubin 0.3 - 1.2 mg/dL 0.6 0.8  Anion gap 5 - 15 18 High  15 CM   Component Ref Range & Units 11:43 (07/04/22) 09:41 (07/04/22)  Troponin I (High Sensitivity) <18 ng/L 7 8 CM   Component Ref Range & Units 09:41 (07/04/22) 2 wk ago (06/20/22)  Lipase 11 - 51 U/L 35 42 CM    EKG: Sinus rhythm. No ST elevations.  Imaging Studies Performed: CT abdomen & pelvis w/ contrast 1. No acute process in the abdomen or pelvis. 2. Findings which may reflect hepatic steatosis.  CXR Unremarkable.  Dimitry Shitarev MS4, [employer] [facility], [facility]  I was personally present and re-performed the exam and medical decision making and  verified the service and findings are accurately documented in the student's note.  Erick Alley, DO 07/05/2022 7:56 AM  FPTS Intern pager: [phone], text pages welcome Secure chat group [facility] Teaching Service

## 2022-07-04 NOTE — ED Provider Notes (Signed)
[facility] EMERGENCY DEPARTMENT Provider Note   CSN: [id number] Arrival date & time: 07/04/22  4098     History No chief complaint on file.   Roy Hester is a 32 y.o. male with a past medical history of alcohol use disorder presenting today due to chest pain.  He reports that this morning he started to have some left-sided chest pain that made him lightheaded and then endorses a near syncopal episode.  He says that it feels like an electrical shock.  Says that he may have hit his head when he has a headache.  No history of ACS.  No palpitations or shortness of breath.  Says that the pain is much worse whenever anything touches his chest.  Reports heavy drinking yesterday and the day before but no drinking today.  Says that he usually drinks every day however he was trying to decrease his drinking prior to 2 days ago.  Denies any drug use.   Also complains of some right lower quadrant pain with nausea.  HPI     Home Medications Prior to Admission medications   Medication Sig Start Date End Date Taking? Authorizing Provider  chlordiazePOXIDE (LIBRIUM) 25 MG capsule 50mg  PO TID x 1D, then 25-50mg  PO BID X 1D, then 25-50mg  PO QD X 1D 06/21/22   Pollina, 06/23/22, MD  ibuprofen (ADVIL) 800 MG tablet Take 1 tablet (800 mg total) by mouth 3 (three) times daily. 12/30/20   03/02/21, PA-C  lidocaine (LIDODERM) 5 % Place 1 patch onto the skin daily. Remove & Discard patch within 12 hours or as directed by MD 12/30/20   03/02/21, PA-C  methocarbamol (ROBAXIN) 500 MG tablet Take 1 tablet (500 mg total) by mouth 2 (two) times daily. 12/30/20   03/02/21, PA-C  ondansetron (ZOFRAN ODT) 4 MG disintegrating tablet Take 1 tablet (4 mg total) by mouth every 8 (eight) hours as needed for nausea or vomiting. 01/30/18   Long, 04/01/18, MD      Allergies    Pork allergy    Review of Systems   Review of Systems  Physical Exam Updated Vital Signs BP (!)  145/93 (BP Location: Right Arm)   Pulse (!) 129   Temp 98.4 F (36.9 C) (Oral)   Resp 17   SpO2 98%  Physical Exam Vitals and nursing note reviewed.  Constitutional:      Appearance: Normal appearance.  HENT:     Head: Normocephalic and atraumatic.  Eyes:     General: No scleral icterus.    Conjunctiva/sclera: Conjunctivae normal.  Cardiovascular:     Rate and Rhythm: Regular rhythm. Tachycardia present.  Pulmonary:     Effort: Pulmonary effort is normal. No respiratory distress.     Breath sounds: No wheezing.  Chest:     Chest wall: Tenderness present.       Comments: Reproducible anterior chest wall tenderness.  No step-offs or crepitus.  No signs of zoster Skin:    Findings: No rash.  Neurological:     Mental Status: He is alert.  Psychiatric:        Mood and Affect: Mood normal.     ED Results / Procedures / Treatments   Labs (all labs ordered are listed, but only abnormal results are displayed) Labs Reviewed  COMPREHENSIVE METABOLIC PANEL - Abnormal; Notable for the following components:      Result Value   Calcium 8.7 (*)    Total Protein 8.2 (*)  AST 47 (*)    Anion gap 18 (*)    All other components within normal limits  URINALYSIS, ROUTINE W REFLEX MICROSCOPIC - Abnormal; Notable for the following components:   Hgb urine dipstick SMALL (*)    Ketones, ur 5 (*)    Protein, ur 30 (*)    Bacteria, UA RARE (*)    All other components within normal limits  CBC  LIPASE, BLOOD  TROPONIN I (HIGH SENSITIVITY)    EKG EKG Interpretation  Date/Time:  Saturday July 04 2022 09:19:09 EST Ventricular Rate:  126 PR Interval:  132 QRS Duration: 84 QT Interval:  316 QTC Calculation: 457 R Axis:   75 Text Interpretation: Sinus tachycardia Left ventricular hypertrophy with repolarization abnormality ( Sokolow-Lyon , Romhilt-Estes ) Abnormal ECG When compared with ECG of 20-Jun-2022 20:43, No significant change since last tracing Confirmed by Linwood Dibbles  [phone]) on 07/04/2022 12:23:42 PM  Radiology DG Chest Portable 1 View  Result Date: 07/04/2022 CLINICAL DATA:  Chest pain. EXAM: PORTABLE CHEST 1 VIEW COMPARISON:  Chest radiograph dated June 20, 2022 FINDINGS: The heart size and mediastinal contours are within normal limits. Both lungs are clear. The visualized skeletal structures are unremarkable. IMPRESSION: No active disease. Electronically Signed   By: Larose Hires D.O.   On: 07/04/2022 12:18    Procedures Procedures   Medications Ordered in ED Medications  LORazepam (ATIVAN) tablet 2 mg (2 mg Oral Given 07/04/22 1152)    Or  LORazepam (ATIVAN) tablet 0-4 mg ( Oral See Alternative 07/04/22 1152)  LORazepam (ATIVAN) tablet 2 mg (has no administration in time range)    Or  LORazepam (ATIVAN) tablet 0-4 mg (has no administration in time range)  thiamine (VITAMIN B1) tablet 100 mg (100 mg Oral Given 07/04/22 1153)    Or  thiamine (VITAMIN B1) injection 100 mg ( Intravenous See Alternative 07/04/22 1153)  sodium chloride 0.9 % bolus 1,000 mL (0 mLs Intravenous Stopped 07/04/22 1424)  ketorolac (TORADOL) 15 MG/ML injection 15 mg (15 mg Intravenous Given 07/04/22 1154)  ondansetron (ZOFRAN) injection 4 mg (4 mg Intravenous Given 07/04/22 1153)  iohexol (OMNIPAQUE) 350 MG/ML injection 75 mL (75 mLs Intravenous Contrast Given 07/04/22 1310)  sodium chloride 0.9 % bolus 1,000 mL (1,000 mLs Intravenous New Bag/Given 07/04/22 1419)  morphine (PF) 4 MG/ML injection 4 mg (4 mg Intravenous Given 07/04/22 1419)    ED Course/ Medical Decision Making/ A&P Clinical Course as of 07/04/22 1538  Sat Jul 04, 2022  1400 Went and reevaluated the patient at bedside.  Heart rate 130s to 140s, higher than when he arrived.  Has been given a fluid bolus as well as Ativan however at this time he says that he is having some pain in his back and difficulty breathing.  Very low suspicion dissection on this patient however we will go ahead and look for a pulmonary embolus.  Of note  RN was made aware of patient's tachycardia and I have requested more fluids and Ativan. [MR]  1504 Reevaluated after second fluid bolus.  Patient continues to be tachycardic in the 120s [MR]    Clinical Course User Index [MR] Talayah Picardi, Gabriel Cirri, PA-C                           Medical Decision Making Amount and/or Complexity of Data Reviewed Labs: ordered. Radiology: ordered.  Risk OTC drugs. Prescription drug management. Decision regarding hospitalization.   32 year old male presenting today due  to chest pain.  The emergent differential diagnosis of chest pain includes: Acute coronary syndrome, pericarditis, aortic dissection, pulmonary embolism, tension pneumothorax, and esophageal rupture.  This is not an exhaustive differential.    Past Medical History / Co-morbidities / Social History: Alcohol use disorder   Additional history: Per chart review patient presented similarly 2 weeks ago.  He says that this feels the exact same.  At that time he had a negative workup.  His pain was also reproducible and thought to be musculoskeletal.  At that time he was also tachycardic that was attributed to alcohol withdrawals.  Of note, patient tells me that last time he was here he required hospitalization for 9 days.  This is nowhere internally or externally.  When asking further about this he says "I do not remember how many days it was but I was 100% admitted."   Physical Exam: Pertinent physical exam findings include Reproducible chest wall tenderness Tachycardic, scleral icterus, positive McBurney's  Lab Tests: I ordered, and personally interpreted labs.  The pertinent results include: No white count Mild ketones and proteinuria.  Normal kidney function.  Elevated anion gap.  Patient likely dehydrated Elevated AST Alcohol 73   Imaging Studies: Chest x-ray and CTAP scan were reviewed and interpreted by me.  Both are negative for acute findings  Cardiac Monitoring:  The patient  was maintained on a cardiac monitor.  I viewed and interpreted the cardiac monitored which showed an underlying rhythm of: sinus tach    Medications: I ordered medication including Toradol, fluids, Zofran, Ativan. Reevaluation of the patient shows that the patient's nausea has improved however he continues to have abdominal pain as well as right leg pain.  Also complains of some pain in his back and difficulty breathing.   MDM/Disposition: This is a 32 year old male who presented with a complaint of chest pain.  Michela Pitcher that it felt like electric shocks.  Patient originally felt better with some Toradol, Zofran, fluids and Ativan however on reevaluation patient's heart rate is in the 130s /140s.  He was then complaining of some discomfort when he breathes and back pain.  I have a very low suspicion dissection, equal and symmetric pulses.  Patient is hypertensive however not severely.  Will not pursue dissection study however will add on a D-dimer.   ACS workup negative.  D-dimer also negative.  He had severe right lower quadrant pain so CT of his abdomen was pursued and this was also negative at this time I suspect patient's pain to be musculoskeletal as it is reproducible on exam.  I suspect that patient's body pain and tachycardia to be secondary to alcohol withdrawals.  Will continue to fluid bolus and give Ativan.   Patient has had 2 fluid boluses and 2 doses of Ativan and his heart rate is actually increasing.  Will require admission for alcohol withdrawals.   Of note, entire encounter was performed with the help of the [employer] Spanish interpreter.  Final Clinical Impression(s) / ED Diagnoses Final diagnoses:  Chest wall pain  Alcohol withdrawal syndrome without complication [facility])    Rx / DC Orders ED Discharge Orders     None        Rhae Hammock, PA-C 07/04/22 1552    Dorie Rank, MD 07/05/22 [phone]

## 2022-07-04 NOTE — Assessment & Plan Note (Addendum)
Today is day 4 since last drink (1/5 around 7 pm). CIWA discontinued after 36 hours with score <3.  Last 2 scores 1>1.  Last received ativan 1/7 in the afternoon.  No tachycardia or tremors. - CIWA discontinued - Ondansetron 4 mg Q6H PRN - Thiamine 100 mg PO daily and folate 1 mg PO daily - Discharge with referral to [facility] for substance use counceling - TOC consulted for PCP placement

## 2022-07-04 NOTE — Assessment & Plan Note (Addendum)
Patient endorses RUQ pain again today.  CT on admission without acute abnormalities, showed potential hepatic steatosis.  Lipase normal on admission.  LFTs remain normal 1/7.  RUQ was normal yesterday, no concern for acute hepatic/biliary pathology at this time. - Continue to monitor  - Scheduled acetaminophen 1000 mg Q6H

## 2022-07-04 NOTE — Progress Notes (Signed)
FMTS Brief Progress Note  S: Patient seen at bedside still reporting some chest discomfort/palpitations.  He is also having some bilateral knee pain and pain at his left wrist as well as pain at the bottom of both of his feet.  He has had this pain since a fall at home prior to admission.  He is also having some abdominal pain mostly in his right lower quadrant.  I reviewed the available labs and imaging results with the patient.  Spanish interpreter used for this encounter   O: BP 132/86   Pulse (!) 114   Temp 97.8 F (36.6 C) (Oral)   Resp 17   Wt 72.6 kg   SpO2 99%   BMI 26.63 kg/m   General: Resting in bed, NAD Cardiovascular: Tachycardic, regular rhythm, no murmurs Pulmonary: Clear to auscultation bilaterally Abdomen: Soft, mild tenderness to palpation primarily at the RLQ, positive bowel sounds Musculoskeletal:  Knees without obvious deformity or swelling.  He has some mild tenderness to palpation.  Guarded on exam, passive ROM significantly limited secondary to pain. Left wrist without obvious deformity or swelling.  There is some tenderness to palpation.  Also with limited passive ROM limited due to pain.  A/P: Alcohol withdrawal Suspect his symptoms are likely secondary to alcohol withdrawal.  Continue CIWA monitoring with as needed benzodiazepine. He does have some significant pain in his left wrist and bilateral knees secondary to fall.  This has not been imaged so we will evaluate further with plain films. Abdominal exam is benign, CT abdomen/pelvis from earlier today without any acute process. - Orders reviewed. Labs for AM ordered, which was adjusted as needed.  - If condition changes, plan includes notify primary team.   Zola Button, MD 07/04/2022, 9:31 PM PGY-3, [place] Night Resident  Please page [phone] with questions.

## 2022-07-04 NOTE — Discharge Instructions (Addendum)
Dear Charolett Bumpers,   Thank you so much for allowing [place] to be part of your care!  You were admitted to [facility] for alcohol withdrawal. You can follow up with behavioral health [facility]) for follow up on alcohol use.  You can contact the clinic below to schedule follow up for general care:  [facility] [id number], [place], [place] [address] [phone]  We are treating you for epididymitis. You will take the antibiotic (doxycycline) for 7 days, twice a day.  POST-HOSPITAL & CARE INSTRUCTIONS Please let PCP/Specialists know of any changes that were made.  Please see medications section of this packet for any medication changes.  Complete full course of treatment for Epididymitis, Doxycycline 100 mg, twice a day, for seven days  DOCTOR'S APPOINTMENT & FOLLOW UP CARE INSTRUCTIONS  No future appointments.  RETURN PRECAUTIONS: Return if you experience difficulty breathing, new onset chest pain, alcohol withdrawal symptoms   Take care and be well!  [facility]  [place], [place] [address] [phone]

## 2022-07-05 ENCOUNTER — Observation Stay (HOSPITAL_COMMUNITY): Payer: Self-pay

## 2022-07-05 ENCOUNTER — Other Ambulatory Visit: Payer: Self-pay

## 2022-07-05 DIAGNOSIS — R3121 Asymptomatic microscopic hematuria: Secondary | ICD-10-CM | POA: Insufficient documentation

## 2022-07-05 DIAGNOSIS — M25579 Pain in unspecified ankle and joints of unspecified foot: Secondary | ICD-10-CM | POA: Insufficient documentation

## 2022-07-05 LAB — CBC
HCT: 38.4 % — ABNORMAL LOW (ref 39.0–52.0)
Hemoglobin: 13.2 g/dL (ref 13.0–17.0)
MCH: 31.4 pg (ref 26.0–34.0)
MCHC: 34.4 g/dL (ref 30.0–36.0)
MCV: 91.4 fL (ref 80.0–100.0)
Platelets: 283 10*3/uL (ref 150–400)
RBC: 4.2 MIL/uL — ABNORMAL LOW (ref 4.22–5.81)
RDW: 13.2 % (ref 11.5–15.5)
WBC: 4.9 10*3/uL (ref 4.0–10.5)
nRBC: 0 % (ref 0.0–0.2)

## 2022-07-05 LAB — COMPREHENSIVE METABOLIC PANEL
ALT: 27 U/L (ref 0–44)
AST: 37 U/L (ref 15–41)
Albumin: 3.1 g/dL — ABNORMAL LOW (ref 3.5–5.0)
Alkaline Phosphatase: 61 U/L (ref 38–126)
Anion gap: 7 (ref 5–15)
BUN: 11 mg/dL (ref 6–20)
CO2: 25 mmol/L (ref 22–32)
Calcium: 8.7 mg/dL — ABNORMAL LOW (ref 8.9–10.3)
Chloride: 101 mmol/L (ref 98–111)
Creatinine, Ser: 0.83 mg/dL (ref 0.61–1.24)
GFR, Estimated: >60 mL/min (ref >60)
Glucose, Bld: 86 mg/dL (ref 70–99)
Potassium: 3.6 mmol/L (ref 3.5–5.1)
Sodium: 133 mmol/L — ABNORMAL LOW (ref 135–145)
Total Bilirubin: 0.7 mg/dL (ref 0.3–1.2)
Total Protein: 6.7 g/dL (ref 6.5–8.1)

## 2022-07-05 LAB — HIV ANTIBODY (ROUTINE TESTING W REFLEX): HIV Screen 4th Generation wRfx: NONREACTIVE

## 2022-07-05 NOTE — Progress Notes (Signed)
FMTS Interim Progress Note  S: Night rounded w/ Dr. Nancy Fetter. iPad spanish interpretor used. Pt reports nausea, tremors, and anxiety.   O: BP (!) 142/87 (BP Location: Right Arm)   Pulse 95   Temp 97.7 F (36.5 C) (Oral)   Resp 16   Wt 72.6 kg   SpO2 96%   BMI 26.63 kg/m   Gen: Alert, speaking in full sentences, laying in bed. NAD CV: RRR Resp: CTAB. Normal WOB on RA Abm: Mild discomfort diffusely to palpation. Soft, normal BS.   A/P: Alcohol Withdrawal - Last drink 1/5 ~7pm (~48hrs ago) - CIWA w/ ativan prn (most recent CIWA 2)   Arlyce Dice, MD 07/05/2022, 8:57 PM PGY-1, [place] Medicine Service pager [phone]

## 2022-07-05 NOTE — Assessment & Plan Note (Deleted)
Dipstick + for hgb but UA on admission had 0-5 rbc. Asymptomatic. Do not think he needs further work up as 0-5 considered normal with his lab.

## 2022-07-05 NOTE — Assessment & Plan Note (Signed)
Resolved today, though patient does note generalized pain and discomfort daily. - Scheduled 1000 mg acetaminophen Q6H

## 2022-07-05 NOTE — Progress Notes (Signed)
Daily Progress Note Intern Pager: [phone]  Patient name: Roy Hester Medical record number: [id number] Date of birth: 19-Aug-1990 Age: 32 y.o. Gender: male  Primary Care Provider: Patient, No Pcp Per Consultants: None Code Status: Full  Pt Overview and Major Events to Date:  1/6 Admitted   Assessment and Plan: Roy Hester is a 32 y.o. male with a PMH of alcohol use disorder and HTN, presenting with chest pain and presyncope found to be in alcohol withdrawal.   * Alcohol withdrawal (HCC) Last drink 1/5 around 7pm. CIWA overnight reached 17, last 2 were 4>2. S/p 5mg  of Ativan overnight. Tachycardia improved. - CIWA precautions with PRN ativan >10 CIWA score - Ondansetron 4 mg Q6H PRN - Thiamine 100 mg PO daily and folate 1 mg PO daily - TOC consulted for substance abuse resources   Abdominal pain Patient endorsed abdominal pain yesterday, did not complain of pain today. CT without acute abnormalities, showed potential hepatic steatosis. LFTs normal this am -Continue to monitor  -Tylenol PRN  Asymptomatic microscopic hematuria Hgb on UA without UTI. 0-5 RBCs on hpf. Pt smokes. Can consider repeating to r/o hematuria.   Ankle pain Endorses right ankle pain, he believes has been occurring for the past week.  Unclear if worsened from fall.  Does have tenderness along posterior lateral malleolus.  Reduced strength and range of motion.  Will obtain x-ray - f/u R ankle XR -Tylenol prn for pain     FEN/GI: Regular diet  PPx: Lovenox  Dispo:Pending continued work up   Subjective:  No acute events overnight.  Elevated CIWA last night requiring Ativan.  This morning he endorses feeling okay states he has had chronic intermittent chest pain.  Denies chest pain currently.  He does endorse right foot pain stating it hurts when he moves it.  This has been happening for the past week, unclear if worsened by the fall.  Objective: Temp:  [97.8 F (36.6 C)-99 F (37.2  C)] 98.1 F (36.7 C) (01/07 0858) Pulse Rate:  [93-126] 106 (01/07 0858) Resp:  [14-24] 22 (01/07 0858) BP: (117-157)/(77-104) 132/93 (01/07 0858) SpO2:  [92 %-99 %] 93 % (01/07 0858) Weight:  [72.6 kg] 72.6 kg (01/06 1924) Physical Exam: General: alert, sitting up in bed, NAD Cardiovascular: Tachycardic, no murmurs  Respiratory: CTAB normal WOB Abdomen: soft, non distended  Extremities: warm, dry. No LE edema. R foot without erythema or ecchymosis. Mild tenderness to palpation posterior to R malleolus. Reduced ROM and strength of R ankle.   Laboratory: Most recent CBC Lab Results  Component Value Date   WBC 4.9 07/05/2022   HGB 13.2 07/05/2022   HCT 38.4 (L) 07/05/2022   MCV 91.4 07/05/2022   PLT 283 07/05/2022   Most recent BMP    Latest Ref Rng & Units 07/05/2022    3:17 AM  BMP  Glucose 70 - 99 mg/dL 86   BUN 6 - 20 mg/dL 11   Creatinine 09/03/2022 - 1.24 mg/dL 3.81   Sodium 8.29 - 937 mmol/L 133   Potassium 3.5 - 5.1 mmol/L 3.6   Chloride 98 - 111 mmol/L 101   CO2 22 - 32 mmol/L 25   Calcium 8.9 - 10.3 mg/dL 8.7     Imaging/Diagnostic Tests: DG Wrist Complete Left: Negative.   DG Knee Complete 4 Views Left:No acute fracture or dislocation. 2. Small suprapatellar joint effusions bilaterally.   DG Knee Complete 4 Views Right: No acute fracture or dislocation. 2. Small suprapatellar joint effusions  bilaterally.  CT ABDOMEN PELVIS W CONTRAST: No acute process in the abdomen or pelvis.  Findings which may reflect hepatic steatosis.  CXR: No active disease.   Shary Key, DO 07/05/2022, 10:21 AM  PGY-3, [place] Intern pager: [phone], text pages welcome Secure chat group [place]

## 2022-07-06 ENCOUNTER — Inpatient Hospital Stay (HOSPITAL_COMMUNITY): Payer: Self-pay

## 2022-07-06 DIAGNOSIS — E876 Hypokalemia: Secondary | ICD-10-CM

## 2022-07-06 LAB — BASIC METABOLIC PANEL
Anion gap: 10 (ref 5–15)
BUN: 7 mg/dL (ref 6–20)
CO2: 24 mmol/L (ref 22–32)
Calcium: 9.1 mg/dL (ref 8.9–10.3)
Chloride: 102 mmol/L (ref 98–111)
Creatinine, Ser: 0.75 mg/dL (ref 0.61–1.24)
GFR, Estimated: >60 mL/min (ref >60)
Glucose, Bld: 95 mg/dL (ref 70–99)
Potassium: 3.2 mmol/L — ABNORMAL LOW (ref 3.5–5.1)
Sodium: 136 mmol/L (ref 135–145)

## 2022-07-06 LAB — URINALYSIS, ROUTINE W REFLEX MICROSCOPIC
Bilirubin Urine: NEGATIVE
Glucose, UA: NEGATIVE mg/dL
Hgb urine dipstick: NEGATIVE
Ketones, ur: NEGATIVE mg/dL
Leukocytes,Ua: NEGATIVE
Nitrite: NEGATIVE
Protein, ur: NEGATIVE mg/dL
Specific Gravity, Urine: 1.01 (ref 1.005–1.030)
pH: 6 (ref 5.0–8.0)

## 2022-07-06 MED ORDER — PANTOPRAZOLE SODIUM 40 MG PO TBEC
40.0000 mg | DELAYED_RELEASE_TABLET | Freq: Every day | ORAL | Status: DC
  Administered 2022-07-07: 40 mg via ORAL
  Filled 2022-07-06: qty 1

## 2022-07-06 MED ORDER — POTASSIUM CHLORIDE 20 MEQ PO PACK
40.0000 meq | PACK | Freq: Once | ORAL | Status: AC
  Administered 2022-07-06: 40 meq via ORAL
  Filled 2022-07-06: qty 2

## 2022-07-06 MED ORDER — PANTOPRAZOLE SODIUM 40 MG IV SOLR
40.0000 mg | INTRAVENOUS | Status: DC
  Administered 2022-07-06: 40 mg via INTRAVENOUS
  Filled 2022-07-06: qty 10

## 2022-07-06 MED ORDER — ACETAMINOPHEN 500 MG PO TABS
1000.0000 mg | ORAL_TABLET | Freq: Four times a day (QID) | ORAL | Status: DC
  Administered 2022-07-06 – 2022-07-07 (×5): 1000 mg via ORAL
  Filled 2022-07-06 (×5): qty 2

## 2022-07-06 MED ORDER — LIDOCAINE 5 % EX PTCH
1.0000 | MEDICATED_PATCH | CUTANEOUS | Status: DC
  Administered 2022-07-06 – 2022-07-07 (×2): 1 via TRANSDERMAL
  Filled 2022-07-06 (×2): qty 1

## 2022-07-06 NOTE — Assessment & Plan Note (Signed)
K of 3.3 today, 3.2 yesterday.  Repleted with PO KCl again. - Daily BMP

## 2022-07-06 NOTE — Progress Notes (Addendum)
Daily Progress Note Intern Pager: [phone]  Patient name: Roy Hester Medical record number: [id number] Date of birth: May 17, 1991 Age: 32 y.o. Gender: male  Primary Care Provider: Patient, No Pcp Per Consultants: None Code Status: Full  Pt Overview and Major Events to Date:  1/6 - Admitted, given 1 dose ativan 1/7 - CIWA 16 overnight, received 1 dose ativan  Assessment and Plan: Roy Hester is a 32 y.o. male presenting with chest pain and presyncope found to be in alcohol withdrawal.  Pertinent PMH/PSH includes alcohol use disorder with history of alcohol withdrawal.  * Alcohol withdrawal ([place]) Last drink 1/5 around 7pm. CIWA overnight at 0 and 2 this morning.  Last received ativan 1/7 in the afternoon.  Tachycardia improved. - CIWA precautions with PRN ativan >10 CIWA score - Ondansetron 4 mg Q6H PRN - Thiamine 100 mg PO daily and folate 1 mg PO daily - TOC consulted for substance abuse resources - Consider late discharge today if CIWAs remain low in afternoon  Abdominal pain Patient endorses RUQ pain again today.  CT without acute abnormalities, showed potential hepatic steatosis.  Lipase normal on admission.  LFTs remain normal.  Given recurrence and consistent localization of RUQ pain, reasonable to order RUQ [place] to evaluate for gallstones. - Continue to monitor  - Scheduled acetaminophen 1000 mg Q6H - RUQ [place]  Hypokalemia K of 3.2 today.  Repleted with PO KCl, which was tolerated. - Daily BMP  Ankle pain Endorsed right ankle pain yesterday with tenderness along posterior lateral malleolus as well as reduced strength and range of motion.  Xray right ankle negative for acute pathology. - Scheduled 1000 mg acetaminophen Q6H  FEN/GI: Regular diet, PIV present PPx: Lovenox Dispo:Home today pending RUQ [place] results and afternoon CIWA scores.   Subjective:  iPad Spanish interpreter used for patient interview.  This morning, the patient continues to  report left chest wall pain as well as left side/back pain and right ankle pain.  Describes left chest wall pain as "electric" in nature and has had it for 2 months.  He has not been requesting PRN acetaminophen and was not aware he could do that.  Also has a return of RUQ pain that was present 2 days ago.  Denies nausea, vomiting, diarrhea.  Also denies shortness of breath, palpitations, or radiation of chest wall pain.  Has not had any hand tremors or visual/auditory hallucinations.  Does endorse having nightmares overnight.  Appetite is still poor, but is able to eat a bit.  Has been urinating, but has not had a BM since admission.  Overall, the patient is very anxious about his health and asks repeatedly about his tests and labs.  Objective: Temp:  [97.7 F (36.5 C)-98.4 F (36.9 C)] 97.8 F (36.6 C) (01/08 0515) Pulse Rate:  [91-102] 92 (01/08 0821) Resp:  [14-24] 22 (01/08 0821) BP: (117-144)/(83-99) 139/99 (01/08 0821) SpO2:  [94 %-96 %] 94 % (01/08 0821)  Physical Exam: General: Age-appropriate, resting comfortably in bed, NAD.  Appears worried.  Alert and at baseline. HEENT: MMM. Normocephalic. Cardiovascular: Regular rate and rhythm. Normal S1/S2. No murmurs, rubs, or gallops appreciated. 2+ radial pulses. Pulmonary: Clear bilaterally to ascultation. No increased WOB, no accessory muscle usage. No wheezes, rales, or crackles. Abdominal: Normoactive bowel sounds. TTP in RUQ with guarding.  Also has RUQ pain with palpation of L quadrants.  No rebound. No HSM. Skin: Warm and dry. MSK: Reproducible L sided chest wall TTP.  TTP over right ankle  medial malleolus and arch of foot.  No peripheral edema bilaterally.  Laboratory: Most recent CBC Lab Results  Component Value Date   WBC 4.9 07/05/2022   HGB 13.2 07/05/2022   HCT 38.4 (L) 07/05/2022   MCV 91.4 07/05/2022   PLT 283 07/05/2022   Most recent BMP    Latest Ref Rng & Units 07/06/2022    2:56 AM  BMP  Glucose 70 - 99 mg/dL  95   BUN 6 - 20 mg/dL 7   Creatinine 8.56 - 3.14 mg/dL 9.70   Sodium 263 - 785 mmol/L 136   Potassium 3.5 - 5.1 mmol/L 3.2   Chloride 98 - 111 mmol/L 102   CO2 22 - 32 mmol/L 24   Calcium 8.9 - 10.3 mg/dL 9.1    Imaging/Diagnostic Tests:  Xray right ankle Radiologist Impression: Negative for acute pathology.  Erick Alley, DO 07/06/2022, 2:00 PM  MS4, [facility], [facility] Family Medicine FPTS Intern pager: [phone], text pages welcome Secure chat group [facility] Teaching Service    I was personally present and re-performed the exam and medical decision making and verified the service and findings are accurately documented in the student's note.  Erick Alley, DO 07/06/2022 2:00 PM

## 2022-07-07 DIAGNOSIS — R369 Urethral discharge, unspecified: Secondary | ICD-10-CM

## 2022-07-07 LAB — BASIC METABOLIC PANEL
Anion gap: 8 (ref 5–15)
BUN: 11 mg/dL (ref 6–20)
CO2: 25 mmol/L (ref 22–32)
Calcium: 8.8 mg/dL — ABNORMAL LOW (ref 8.9–10.3)
Chloride: 100 mmol/L (ref 98–111)
Creatinine, Ser: 0.97 mg/dL (ref 0.61–1.24)
GFR, Estimated: >60 mL/min (ref >60)
Glucose, Bld: 85 mg/dL (ref 70–99)
Potassium: 3.3 mmol/L — ABNORMAL LOW (ref 3.5–5.1)
Sodium: 133 mmol/L — ABNORMAL LOW (ref 135–145)

## 2022-07-07 MED ORDER — DOXYCYCLINE HYCLATE 100 MG PO TBEC: 100.0000 mg | DELAYED_RELEASE_TABLET | Freq: Two times a day (BID) | ORAL | 0 refills | Status: AC

## 2022-07-07 MED ORDER — FOLIC ACID 1 MG PO TABS: 1.0000 mg | ORAL_TABLET | Freq: Every day | ORAL | Status: AC

## 2022-07-07 MED ORDER — CEFTRIAXONE SODIUM 500 MG IJ SOLR: 500.0000 mg | Freq: Once | INTRAMUSCULAR | 0 refills | Status: DC

## 2022-07-07 MED ORDER — CEFTRIAXONE SODIUM 500 MG IJ SOLR
500.0000 mg | INTRAMUSCULAR | Status: DC
  Administered 2022-07-07: 500 mg via INTRAMUSCULAR
  Filled 2022-07-07: qty 500

## 2022-07-07 MED ORDER — DOXYCYCLINE HYCLATE 100 MG PO TBEC: 100.0000 mg | DELAYED_RELEASE_TABLET | Freq: Two times a day (BID) | ORAL | 0 refills | Status: DC

## 2022-07-07 MED ORDER — POTASSIUM CHLORIDE 20 MEQ PO PACK
40.0000 meq | PACK | Freq: Once | ORAL | Status: AC
  Administered 2022-07-07: 40 meq via ORAL
  Filled 2022-07-07: qty 2

## 2022-07-07 MED ORDER — DEXTROSE 5 % IV SOLN: 500.0000 mg | Freq: Once | INTRAVENOUS | Status: DC

## 2022-07-07 MED ORDER — VITAMIN B-1 100 MG PO TABS: 100.0000 mg | ORAL_TABLET | Freq: Every day | ORAL | Status: AC

## 2022-07-07 MED ORDER — ADULT MULTIVITAMIN W/MINERALS CH: 1.0000 | ORAL_TABLET | Freq: Every day | ORAL | Status: AC

## 2022-07-07 NOTE — Assessment & Plan Note (Signed)
Patient notes penile discharge, dysuria, urinary frequency, and has scrotal abscesses on exam as well as some mild prostatic tenderness.  Low suspicion for bacterial prostatitis given unconvincing physical exam, lack of fevers, and unrevealing UA.  GC/chlamydia needs to be collected.  Small amount of blood on UA on admission, normal UA 1/8.  Can be follow up outpatient. - Outpatient follow up

## 2022-07-07 NOTE — Progress Notes (Cosign Needed Addendum)
Daily Progress Note Intern Pager: [phone]  Patient name: Roy Hester Medical record number: [id number] Date of birth: Sep 25, 1990 Age: 32 y.o. Gender: male  Primary Care Provider: Patient, No Pcp Per Consultants: None Code Status: Full  Pt Overview and Major Events to Date:  1/6 - Admitted, given 1 dose ativan 1/7 - CIWA 16 overnight, received 1 dose ativan 1/8 - New penile discharge, GC/chlamydia ordered 1/9 - CIWA discontinued  Assessment and Plan: Roy Hester is a 32 y.o. male presenting with chest pain and presyncope found to be in alcohol withdrawal.  Pertinent PMH/PSH includes alcohol use disorder with history of alcohol withdrawal.  * Alcohol withdrawal ([place]) Today is day 4 since last drink (1/5 around 7 pm). CIWA discontinued after 36 hours with score <3.  Last 2 scores 1>1.  Last received ativan 1/7 in the afternoon.  No tachycardia or tremors. - CIWA discontinued - Ondansetron 4 mg Q6H PRN - Thiamine 100 mg PO daily and folate 1 mg PO daily - Discharge with [facility] to [facility] for substance use counceling - TOC consulted for PCP placement  Abdominal pain Patient endorses RUQ pain again today.  CT on admission without acute abnormalities, showed potential hepatic steatosis.  Lipase normal on admission.  LFTs remain normal 1/7.  RUQ was normal yesterday, no concern for acute hepatic/biliary pathology at this time. - Continue to monitor  - Scheduled acetaminophen 1000 mg Q6H  Penile discharge Patient notes penile discharge, dysuria, urinary frequency, and has scrotal abscesses on exam as well as some mild prostatic tenderness.  Low suspicion for bacterial prostatitis given unconvincing physical exam, lack of fevers, and unrevealing UA.  GC/chlamydia needs to be collected.  Small amount of blood on UA on admission, normal UA 1/8.  Can be follow up outpatient. - Outpatient follow up  Hypokalemia K of 3.3 today, 3.2 yesterday.  Repleted with PO KCl  again. - Daily BMP  Ankle pain Resolved today, though patient does note generalized pain and discomfort daily. - Scheduled 1000 mg acetaminophen Q6H  FEN/GI: Regular diet, PIV present  PPx: Lovenox Dispo:Home today. Barriers include workup of new penile discharge.   Subjective:  Patient does report continuing pain and discomfort in his chest, abdomen, feet.  The symptoms are unchanged from previous days except for the foot pain which is newer and he describes as cramps in the soles of his feet.  He states that all of his pain is improved with his scheduled Tylenol.  Rates pain as a 4 out of 10 compared to 8 out of 10 yesterday.  Patient notes that he is not established with a PCP.  He reports that he has had lower abdominal discomfort and penile discharge overnight and endorses dysuria, urinary frequency, and some painful white bumps on his scrotum, which have burst and drained purulent fluid before.  Notes that he has been unable to eat due to nausea and thinks that is contributing to some of his abdominal pain and associated burping.  Objective: Temp:  [98.1 F (36.7 C)-98.3 F (36.8 C)] 98.1 F (36.7 C) (01/09 0310) Pulse Rate:  [91-101] 94 (01/09 0310) Resp:  [15-20] 20 (01/09 0310) BP: (119-133)/(78-97) 128/88 (01/09 0310) SpO2:  [89 %-93 %] 91 % (01/09 0310)  Physical Exam: General: Age-appropriate, resting comfortably in bed, NAD, mildly anxious appearing, alert and at baseline. Cardiovascular: Regular rate and rhythm. Normal S1/S2. No murmurs, rubs, or gallops appreciated. 2+ radial pulses. Abdominal: Mild TTP in RUQ. No rebound or guarding. No  HSM. GU: No penile discharge. Single healing scrotal furuncle <1 cm and second furuncle <1 cm with erythema and TTP.  Some hypopigmentation of scrotal skin. Rectal: Prostate smooth and not boggy.  Mild TTP. Skin: Warm and dry.  No skin findings over soles of feet. Extremities: Normal sensation on soles of feet. Capillary refill < 2  seconds.  Laboratory: Most recent CBC Lab Results  Component Value Date   WBC 4.9 07/05/2022   HGB 13.2 07/05/2022   HCT 38.4 (L) 07/05/2022   MCV 91.4 07/05/2022   PLT 283 07/05/2022   Most recent BMP    Latest Ref Rng & Units 07/07/2022    5:38 AM  BMP  Glucose 70 - 99 mg/dL 85   BUN 6 - 20 mg/dL 11   Creatinine 1.69 - 1.24 mg/dL 6.78   Sodium 938 - 101 mmol/L 133   Potassium 3.5 - 5.1 mmol/L 3.3   Chloride 98 - 111 mmol/L 100   CO2 22 - 32 mmol/L 25   Calcium 8.9 - 10.3 mg/dL 8.8     Other pertinent labs: GC/chlamydia - Pending UA 1/8 - Unremarkable  Imaging/Diagnostic Tests: RUQ [place] 1/8 - Unremarkable Bladder scan 1/8 - 35 mL  Shitarev, Dimitry, Medical Student 07/07/2022, 10:57 AM  MS4, [employer], [facility] Family Medicine FPTS Intern pager: [phone], text pages welcome Secure chat group [facility] Teaching Service  I was personally present and performed or re-performed the history, physical exam and medical decision making activities of this service and have verified that the service and findings are accurately documented in the student's note.  Bess Kinds, MD                  07/07/2022, 3:08 PM

## 2022-07-07 NOTE — TOC Progression Note (Signed)
Transition of Care [facility]) - Progression Note    Patient Details  Name: Roy Hester MRN: [id number] Date of Birth: Jan 24, 1991  Transition of Care [facility]) CM/SW Contact  Carles Collet, RN Phone Number: 07/07/2022, 11:05 AM  Clinical Narrative:     Consult for PCP, requested Family Medicine to continue seeing after discharge, declined. Request sent to CMA to schedule at any clinic that is accepting uninsured patients. Appointment will be added to AVS.  Will continue to follow for medication as needed at DC. Please send scripts through [facility] so they can be filled and sent home with the patient.        Expected Discharge Plan and Services                                               Social Determinants of Health (SDOH) Interventions SDOH Screenings   Tobacco Use: High Risk (07/04/2022)    Readmission Risk Interventions     No data to display

## 2022-07-07 NOTE — Progress Notes (Signed)
TOC consulted for ETOH use. CSW placed info for [facility] on AVS in Spanish for follow up as needed.   Gilmore Laroche, MSW, [facility]

## 2022-07-07 NOTE — Progress Notes (Signed)
CSW met with patient utilizing Spanish interpretor. Patient requested taxi home. CSW obtained address: [address] [place]. Transport waiver signed and placed in hard chart. Patient aware that follow up info will be on his AVS. No other needs identified at this time.   Gilmore Laroche, MSW, [facility]

## 2022-07-07 NOTE — Discharge Summary (Addendum)
Family Medicine Teaching [facility] Discharge Summary  Patient name: Roy Hester Medical record number: [id number] Date of birth: 1991/02/20 Age: 32 y.o. Gender: male Date of Admission: 07/04/2022  Date of Discharge: 07/07/22 Admitting Physician: Nestor Ramp, MD  Primary Care Provider: Patient, No Pcp Per Consultants: None  Indication for Hospitalization: Alcohol withdrawal  Brief Hospital Course:  Roy Hester is a 32 y.o. male that presented with chest pain and pre-syncope found to be in alcohol withdrawal. PMH significant for alcohol use disorder with multiple recent ED visit for alcohol withdrawal symptoms.  Alcohol withdrawal Patient admitted for chest pain and pre-syncope after reported binge drinking episode (12 beers and fifth of liquor) the night prior.  Patient reported that he had not had any alcohol prior to binge for 5 months.  Placed on CIWA precautions on admission and required 1 dose Ativan on admission as well as 1 more dose overnight 1/7.  Was also given thiamine and folate supplementation during hospitalization, discharged with OTC multivitamins.  Patient with CIWAs <3 for 48 hours at time of discharge, which was 4 days from last drink.  Epididymidis Patient reported penile discharge with associated dysuria and urinary frequency at the end of hospitalization.  He also noted scrotal pain when brushing against objects and showering.  Penile exam showed 2 furuncles, scrotal tenderness, and no penile discharge.  Prostate exam with mild TTP.  Repeat UA was unremarkable.  Given symptoms patient treated for Epididymidis. Patient given 1 dose ceftriaxone 500 mg IM and discharged on 10 day course of doxycycline 100 mg PO BID (starting today) for epididymidis concern.  Abdominal pain RUQ pain noted on admission, care team felt it was related to alcohol withdrawal and effects of recent binge drinking.  CT abdomen and pelvis showed no acute pathologies though noted  possible hepatic steatosis.  CMP notable for mildly elevated AST but otherwise reassuring on admission.  Lipase WNL.  Patient was given fluids with improvement by 1/7, though RUQ pain recurred a day later.  RUQ US obtained and was normal.  Fall and MSK pain Patient reported that he had fall at home prior to calling EMS.  Was able to get up and call 911. Positive for ethanol intoxication on admission.  Xrays of R and L knees as well as L wrist and later R ankle showed no osseous abnormalities.  Patient reported multiple areas of pain across his body throughout the admission, most notably in his left upper chest wall (described as "electric"), which was reproducible with palpation and pleuritic in nature.  Pain improved with scheduled acetaminophen 1000 mg Q6, but remained to mild degree at discharge.    Discharge Diagnoses/Problem List:  Alcohol withdrawal Abdominal pain Penile discharge Hypokalemia Ankle Pain Chest Wall Pain  Disposition: Home  Discharge Condition: Stable, off CIWA, no   Discharge Exam:  General: Age-appropriate, resting in bed, NAD, alert and at baseline. Cardiovascular: Regular rate and rhythm. Normal S1/S2. No murmurs, rubs, or gallops appreciated. 2+ radial pulses. Abdominal: Mild TTP in RUQ. No rebound or guarding. No HSM. GU: No penile discharge. Single healing scrotal furuncle <1 cm and second furuncle <1 cm with erythema and TTP.  Some hypopigmentation of scrotal skin.  Scrotal tenderness with palpation. Rectal: Prostate smooth and not boggy.  Mild TTP. Extremities: Normal sensation on soles of feet. Capillary refill < 2 seconds.  Issues for PCP Follow Up:  Single appointment with [facility] for hospital follow-up, continuity appointment with PCP at [facility] on 09/24/22  Alcohol cessation counseling referral placed to [facility] Intermittent left-sided chest wall pain Follow up epididymidis treatment course and symptoms for resolution  Significant  Procedures: None  Significant Labs and Imaging:  No results for input(s): "WBC", "HGB", "HCT", "PLT" in the last 48 hours. Recent Labs  Lab 07/06/22 0256 07/07/22 0538  NA 136 133*  K 3.2* 3.3*  CL 102 100  CO2 24 25  GLUCOSE 95 85  BUN 7 11  CREATININE 0.75 0.97  CALCIUM 9.1 8.8*   - CT abdomen & pelvis w/ contrast 1/6: Possible hepatic steatosis, no acute abnormality. - RUQ [place]: Unremarkable  Results/Tests Pending at Time of Discharge: - GC/chlamydia probe  Discharge Medications:  - Folic acid 1 mg daily OTC - Multivitamin daily OTC - Thiamine 100 mg daily OTC - Doxycycline 100 mg BID for 10 days  Discharge Instructions: Please refer to Patient Instructions section of EMR for full details.  Patient was counseled important signs and symptoms that should prompt return to medical care, changes in medications, dietary instructions, activity restrictions, and follow up appointments.   Follow-Up Appointments:  [place]. Call.   Specialty: Urgent Care Why: Abierto 24 horas al da, 7 das a la semana, no se requiere cita previa. Si tiene un problema de salud mental o uso de sustancias, nuestro personal puede ayudarlo a evaluar sus necesidades. Contact information: [place] [address] [place]. Go on 09/24/2022.   Why: 08:50 appointment time Contact information: [place] [id number] [place] Follow up on 07/10/2022.   Why: 11:30 AM, por favor llegue 15 minutos antes Contact information: [place]        Charlott Rakes, MD Follow up.   Specialty: Family Medicine Why: TIME : 8:50 AM DATE : [facility] PT'S WILL BE PUT ON WAIT LIST IF CAN BE SEEN SOONER Contact information: [address] [place] [address] [phone]                Dimitry Katy Fitch MS4, [facility], [facility]  07/07/2022, 2:39 PM  I was personally present and performed or re-performed the history, physical exam and medical decision making activities of this service and have verified that the service and findings are accurately documented in the student's note.  Holley Bouche, MD                  07/07/2022, 2:40 PM

## 2022-07-08 LAB — GC/CHLAMYDIA PROBE AMP (~~LOC~~) NOT AT ARMC
Chlamydia: NEGATIVE
Comment: NEGATIVE
Comment: NORMAL
Neisseria Gonorrhea: NEGATIVE

## 2022-07-10 ENCOUNTER — Inpatient Hospital Stay: Payer: Self-pay | Admitting: *Deleted

## 2022-09-24 ENCOUNTER — Ambulatory Visit: Payer: Self-pay | Admitting: Family Medicine

## 2022-10-12 NOTE — Progress Notes (Signed)
Erroneous encounter-disregard

## 2022-10-19 ENCOUNTER — Encounter: Payer: Self-pay | Admitting: Family

## 2022-10-19 DIAGNOSIS — Z7689 Persons encountering health services in other specified circumstances: Secondary | ICD-10-CM

## 2022-10-19 DIAGNOSIS — Z603 Acculturation difficulty: Secondary | ICD-10-CM
